# Patient Record
Sex: Male | Born: 1980 | Race: Black or African American | Hispanic: No | Marital: Married | Smoking: Current every day smoker
Health system: Southern US, Community
[De-identification: ages and names within clinical notes are randomized; demographics above are authoritative.]

## PROBLEM LIST (undated history)

## (undated) DIAGNOSIS — F32A Depression, unspecified: Secondary | ICD-10-CM

## (undated) DIAGNOSIS — J189 Pneumonia, unspecified organism: Secondary | ICD-10-CM

## (undated) DIAGNOSIS — E119 Type 2 diabetes mellitus without complications: Secondary | ICD-10-CM

## (undated) DIAGNOSIS — Z8719 Personal history of other diseases of the digestive system: Secondary | ICD-10-CM

## (undated) DIAGNOSIS — G4730 Sleep apnea, unspecified: Secondary | ICD-10-CM

## (undated) DIAGNOSIS — I10 Essential (primary) hypertension: Secondary | ICD-10-CM

## (undated) HISTORY — PX: WRIST SURGERY: SHX841

## (undated) HISTORY — DX: Type 2 diabetes mellitus without complications: E11.9

## (undated) HISTORY — DX: Essential (primary) hypertension: I10

## (undated) HISTORY — PX: WISDOM TOOTH EXTRACTION: SHX21

## (undated) HISTORY — DX: Personal history of other diseases of the digestive system: Z87.19

---

## 1999-05-13 ENCOUNTER — Emergency Department (HOSPITAL_COMMUNITY): Admission: EM | Admit: 1999-05-13 | Discharge: 1999-05-13 | Payer: Self-pay | Admitting: Emergency Medicine

## 2001-05-21 ENCOUNTER — Encounter: Payer: Self-pay | Admitting: Emergency Medicine

## 2001-05-21 ENCOUNTER — Emergency Department (HOSPITAL_COMMUNITY): Admission: EM | Admit: 2001-05-21 | Discharge: 2001-05-21 | Payer: Self-pay | Admitting: Emergency Medicine

## 2001-05-23 ENCOUNTER — Emergency Department (HOSPITAL_COMMUNITY): Admission: EM | Admit: 2001-05-23 | Discharge: 2001-05-23 | Payer: Self-pay | Admitting: Emergency Medicine

## 2001-05-29 ENCOUNTER — Emergency Department (HOSPITAL_COMMUNITY): Admission: EM | Admit: 2001-05-29 | Discharge: 2001-05-29 | Payer: Self-pay | Admitting: Emergency Medicine

## 2001-06-01 ENCOUNTER — Emergency Department (HOSPITAL_COMMUNITY): Admission: EM | Admit: 2001-06-01 | Discharge: 2001-06-01 | Payer: Self-pay | Admitting: Emergency Medicine

## 2001-06-07 ENCOUNTER — Emergency Department (HOSPITAL_COMMUNITY): Admission: EM | Admit: 2001-06-07 | Discharge: 2001-06-07 | Payer: Self-pay | Admitting: Emergency Medicine

## 2008-04-29 ENCOUNTER — Emergency Department (HOSPITAL_COMMUNITY): Admission: EM | Admit: 2008-04-29 | Discharge: 2008-04-29 | Payer: Self-pay | Admitting: Emergency Medicine

## 2009-01-25 ENCOUNTER — Emergency Department (HOSPITAL_COMMUNITY): Admission: EM | Admit: 2009-01-25 | Discharge: 2009-01-25 | Payer: Self-pay | Admitting: Family Medicine

## 2009-03-26 ENCOUNTER — Emergency Department (HOSPITAL_COMMUNITY): Admission: EM | Admit: 2009-03-26 | Discharge: 2009-03-26 | Payer: Self-pay | Admitting: Emergency Medicine

## 2009-06-05 ENCOUNTER — Emergency Department (HOSPITAL_COMMUNITY): Admission: EM | Admit: 2009-06-05 | Discharge: 2009-06-05 | Payer: Self-pay | Admitting: Emergency Medicine

## 2009-07-12 ENCOUNTER — Emergency Department (HOSPITAL_COMMUNITY): Admission: EM | Admit: 2009-07-12 | Discharge: 2009-07-13 | Payer: Self-pay | Admitting: Emergency Medicine

## 2009-07-14 ENCOUNTER — Emergency Department (HOSPITAL_COMMUNITY): Admission: EM | Admit: 2009-07-14 | Discharge: 2009-07-14 | Payer: Self-pay | Admitting: Emergency Medicine

## 2009-07-15 ENCOUNTER — Ambulatory Visit: Payer: Self-pay | Admitting: Internal Medicine

## 2009-07-15 ENCOUNTER — Inpatient Hospital Stay (HOSPITAL_COMMUNITY): Admission: EM | Admit: 2009-07-15 | Discharge: 2009-07-19 | Payer: Self-pay | Admitting: Emergency Medicine

## 2009-08-03 ENCOUNTER — Encounter: Payer: Self-pay | Admitting: Internal Medicine

## 2009-08-03 DIAGNOSIS — Z8719 Personal history of other diseases of the digestive system: Secondary | ICD-10-CM

## 2009-08-03 DIAGNOSIS — J189 Pneumonia, unspecified organism: Secondary | ICD-10-CM

## 2009-08-03 DIAGNOSIS — Z87448 Personal history of other diseases of urinary system: Secondary | ICD-10-CM

## 2009-08-03 HISTORY — DX: Personal history of other diseases of the digestive system: Z87.19

## 2010-08-28 LAB — POCT I-STAT, CHEM 8
BUN: 8 mg/dL (ref 6–23)
HCT: 42 % (ref 39.0–52.0)
Hemoglobin: 14.3 g/dL (ref 13.0–17.0)
Sodium: 135 mEq/L (ref 135–145)
TCO2: 25 mmol/L (ref 0–100)

## 2010-08-28 LAB — URINALYSIS, ROUTINE W REFLEX MICROSCOPIC
Glucose, UA: NEGATIVE mg/dL
Leukocytes, UA: NEGATIVE
Nitrite: NEGATIVE
Protein, ur: NEGATIVE mg/dL
Urobilinogen, UA: 1 mg/dL (ref 0.0–1.0)

## 2010-08-28 LAB — URINE MICROSCOPIC-ADD ON

## 2010-08-28 LAB — POCT CARDIAC MARKERS: Troponin i, poc: <0.05 ng/mL (ref 0.00–0.09)

## 2010-08-28 LAB — RAPID URINE DRUG SCREEN, HOSP PERFORMED
Amphetamines: NOT DETECTED
Tetrahydrocannabinol: POSITIVE — AB

## 2010-08-31 LAB — CBC
HCT: 40.5 % (ref 39.0–52.0)
HCT: 42.2 % (ref 39.0–52.0)
HCT: 43 % (ref 39.0–52.0)
Hemoglobin: 14.4 g/dL (ref 13.0–17.0)
MCHC: 33.3 g/dL (ref 30.0–36.0)
MCHC: 33.5 g/dL (ref 30.0–36.0)
MCV: 82.9 fL (ref 78.0–100.0)
MCV: 84.5 fL (ref 78.0–100.0)
Platelets: 211 10*3/uL (ref 150–400)
Platelets: 221 10*3/uL (ref 150–400)
Platelets: 228 10*3/uL (ref 150–400)
Platelets: 259 10*3/uL (ref 150–400)
RBC: 5.1 MIL/uL (ref 4.22–5.81)
RBC: 5.1 MIL/uL (ref 4.22–5.81)
RBC: 5.22 MIL/uL (ref 4.22–5.81)
RDW: 13.8 % (ref 11.5–15.5)
RDW: 14.3 % (ref 11.5–15.5)
WBC: 13.2 10*3/uL — ABNORMAL HIGH (ref 4.0–10.5)
WBC: 13.2 10*3/uL — ABNORMAL HIGH (ref 4.0–10.5)
WBC: 14.3 10*3/uL — ABNORMAL HIGH (ref 4.0–10.5)

## 2010-08-31 LAB — COMPREHENSIVE METABOLIC PANEL
ALT: 29 U/L (ref 0–53)
AST: 15 U/L (ref 0–37)
Albumin: 3.6 g/dL (ref 3.5–5.2)
CO2: 22 mEq/L (ref 19–32)
Calcium: 8.9 mg/dL (ref 8.4–10.5)
Creatinine, Ser: 0.95 mg/dL (ref 0.4–1.5)
GFR calc Af Amer: >60 mL/min (ref >60)
GFR calc non Af Amer: >60 mL/min (ref >60)
Sodium: 137 mEq/L (ref 135–145)

## 2010-08-31 LAB — URINALYSIS, ROUTINE W REFLEX MICROSCOPIC
Nitrite: NEGATIVE
Protein, ur: NEGATIVE mg/dL
Specific Gravity, Urine: 1.015 (ref 1.005–1.030)
Urobilinogen, UA: 1 mg/dL (ref 0.0–1.0)

## 2010-08-31 LAB — DIFFERENTIAL
Basophils Absolute: 0 10*3/uL (ref 0.0–0.1)
Basophils Relative: 0 % (ref 0–1)
Basophils Relative: 0 % (ref 0–1)
Eosinophils Absolute: 0 10*3/uL (ref 0.0–0.7)
Eosinophils Absolute: 0 10*3/uL (ref 0.0–0.7)
Eosinophils Absolute: 0.1 10*3/uL (ref 0.0–0.7)
Eosinophils Relative: 0 % (ref 0–5)
Eosinophils Relative: 0 % (ref 0–5)
Eosinophils Relative: 1 % (ref 0–5)
Lymphocytes Relative: 13 % (ref 12–46)
Lymphocytes Relative: 16 % (ref 12–46)
Lymphs Abs: 1.8 10*3/uL (ref 0.7–4.0)
Lymphs Abs: 2.1 10*3/uL (ref 0.7–4.0)
Lymphs Abs: 2.2 10*3/uL (ref 0.7–4.0)
Lymphs Abs: 2.4 10*3/uL (ref 0.7–4.0)
Monocytes Absolute: 1.5 10*3/uL — ABNORMAL HIGH (ref 0.1–1.0)
Monocytes Relative: 11 % (ref 3–12)
Monocytes Relative: 14 % — ABNORMAL HIGH (ref 3–12)
Neutro Abs: 9 10*3/uL — ABNORMAL HIGH (ref 1.7–7.7)
Neutro Abs: 9 10*3/uL — ABNORMAL HIGH (ref 1.7–7.7)
Neutrophils Relative %: 68 % (ref 43–77)
Neutrophils Relative %: 68 % (ref 43–77)
Neutrophils Relative %: 69 % (ref 43–77)

## 2010-08-31 LAB — URINE MICROSCOPIC-ADD ON

## 2010-08-31 LAB — BASIC METABOLIC PANEL
BUN: 7 mg/dL (ref 6–23)
CO2: 25 mEq/L (ref 19–32)
CO2: 25 mEq/L (ref 19–32)
CO2: 27 mEq/L (ref 19–32)
Calcium: 8.9 mg/dL (ref 8.4–10.5)
Chloride: 100 mEq/L (ref 96–112)
Chloride: 103 mEq/L (ref 96–112)
Creatinine, Ser: 1 mg/dL (ref 0.4–1.5)
GFR calc Af Amer: >60 mL/min (ref >60)
GFR calc Af Amer: >60 mL/min (ref >60)
GFR calc non Af Amer: >60 mL/min (ref >60)
Glucose, Bld: 103 mg/dL — ABNORMAL HIGH (ref 70–99)
Glucose, Bld: 117 mg/dL — ABNORMAL HIGH (ref 70–99)
Potassium: 3.3 mEq/L — ABNORMAL LOW (ref 3.5–5.1)
Potassium: 3.4 mEq/L — ABNORMAL LOW (ref 3.5–5.1)
Potassium: 3.9 mEq/L (ref 3.5–5.1)
Sodium: 136 mEq/L (ref 135–145)
Sodium: 137 mEq/L (ref 135–145)

## 2010-08-31 LAB — RAPID URINE DRUG SCREEN, HOSP PERFORMED
Amphetamines: NOT DETECTED
Benzodiazepines: NOT DETECTED
Tetrahydrocannabinol: POSITIVE — AB

## 2010-08-31 LAB — LIPASE, BLOOD: Lipase: 16 U/L (ref 11–59)

## 2010-09-15 LAB — GC/CHLAMYDIA PROBE AMP, GENITAL: Chlamydia, DNA Probe: NEGATIVE

## 2010-09-15 LAB — RPR: RPR Ser Ql: NONREACTIVE

## 2010-09-15 LAB — HIV ANTIBODY (ROUTINE TESTING W REFLEX): HIV: NONREACTIVE

## 2011-03-14 LAB — DIFFERENTIAL
Basophils Absolute: 0.1 10*3/uL (ref 0.0–0.1)
Eosinophils Absolute: 0.1 10*3/uL (ref 0.0–0.7)
Eosinophils Relative: 1 % (ref 0–5)
Lymphocytes Relative: 19 % (ref 12–46)
Lymphs Abs: 2.4 10*3/uL (ref 0.7–4.0)
Neutrophils Relative %: 69 % (ref 43–77)

## 2011-03-14 LAB — POCT URINALYSIS DIP (DEVICE)
Glucose, UA: NEGATIVE mg/dL
Ketones, ur: NEGATIVE mg/dL
Protein, ur: 30 mg/dL — AB
Urobilinogen, UA: 1 mg/dL (ref 0.0–1.0)

## 2011-03-14 LAB — CBC
HCT: 44.9 % (ref 39.0–52.0)
Platelets: 231 10*3/uL (ref 150–400)
RDW: 14 % (ref 11.5–15.5)
WBC: 12.7 10*3/uL — ABNORMAL HIGH (ref 4.0–10.5)

## 2011-03-14 LAB — OCCULT BLOOD X 1 CARD TO LAB, STOOL: Fecal Occult Bld: NEGATIVE

## 2011-05-09 ENCOUNTER — Emergency Department (HOSPITAL_COMMUNITY)
Admission: EM | Admit: 2011-05-09 | Discharge: 2011-05-09 | Discharge status: Against Medical Advice | Payer: Self-pay | Attending: Emergency Medicine | Admitting: Emergency Medicine

## 2011-05-09 DIAGNOSIS — R079 Chest pain, unspecified: Secondary | ICD-10-CM | POA: Insufficient documentation

## 2011-05-09 DIAGNOSIS — R05 Cough: Secondary | ICD-10-CM | POA: Insufficient documentation

## 2011-05-09 DIAGNOSIS — R059 Cough, unspecified: Secondary | ICD-10-CM | POA: Insufficient documentation

## 2011-05-09 DIAGNOSIS — R0602 Shortness of breath: Secondary | ICD-10-CM | POA: Insufficient documentation

## 2011-05-09 HISTORY — DX: Pneumonia, unspecified organism: J18.9

## 2011-05-09 NOTE — ED Notes (Signed)
Chest pain began this am with cough that increase the pain, denies any n/v/d feels like the same pain when he had pneumonia.   Pt. Is sob

## 2011-05-09 NOTE — ED Notes (Signed)
Called for pt in lobby.  Pt not located.

## 2011-11-20 IMAGING — CR DG LUMBAR SPINE COMPLETE 4+V
5 series · 5 of 5 positions shown · non-contrast
Comparison: None

CLINICAL DATA: Left lower abdomen and back pain, no trauma

LUMBAR SPINE - COMPLETE 4+ VIEW

[t l-spine a.p.]
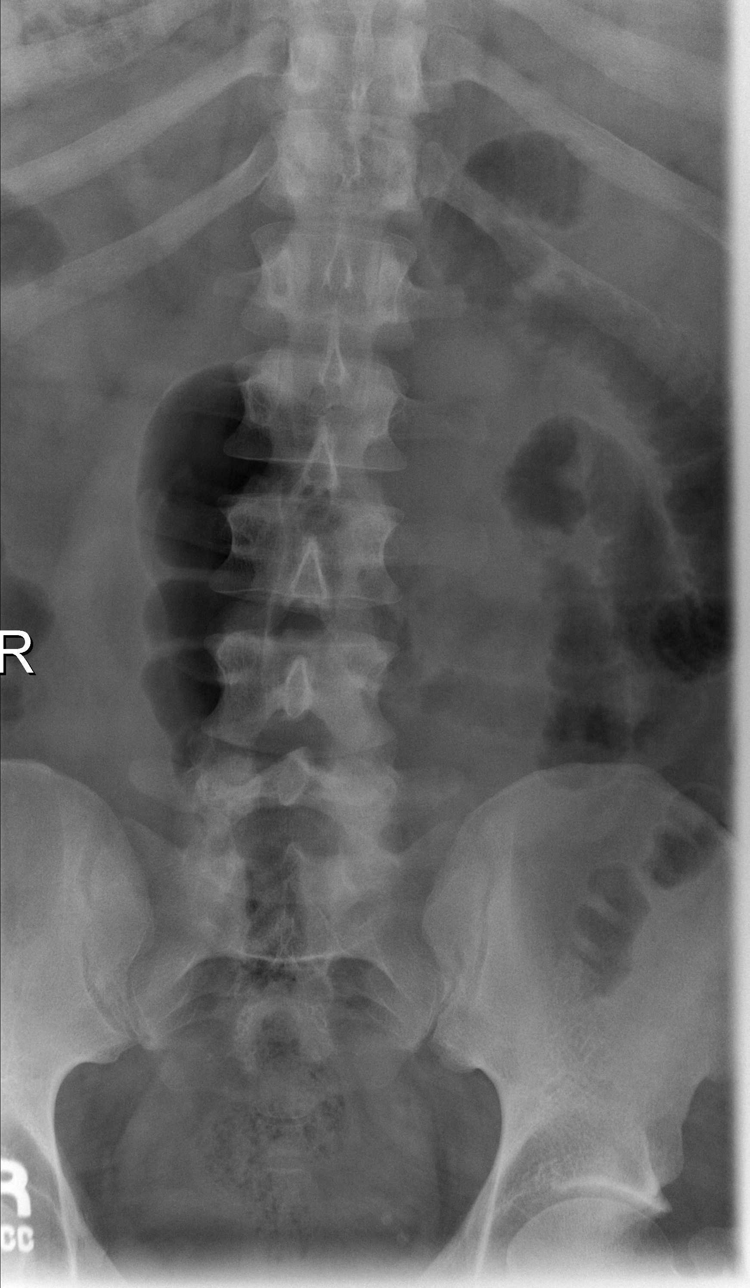

[t l-spine oblique exposure (1 of 2)]
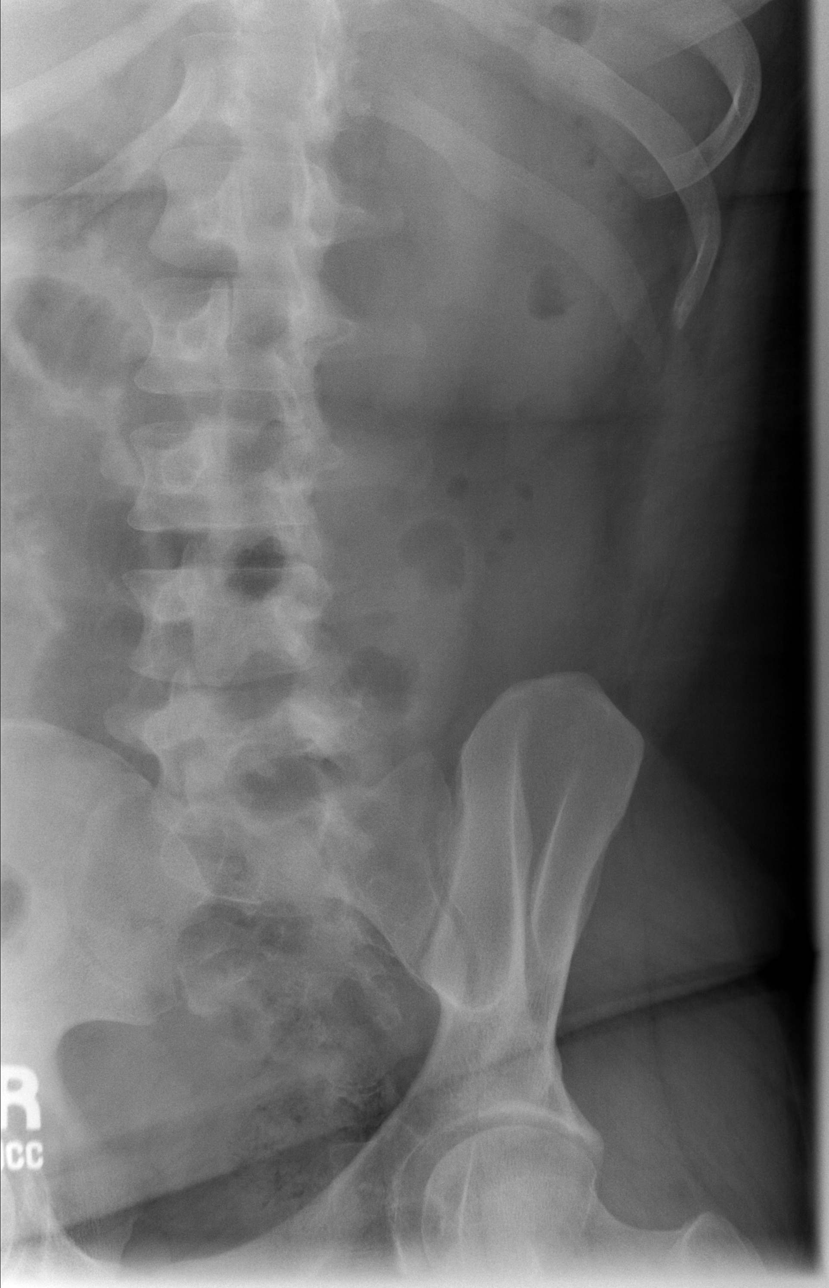

[t l-spine oblique exposure (2 of 2)]
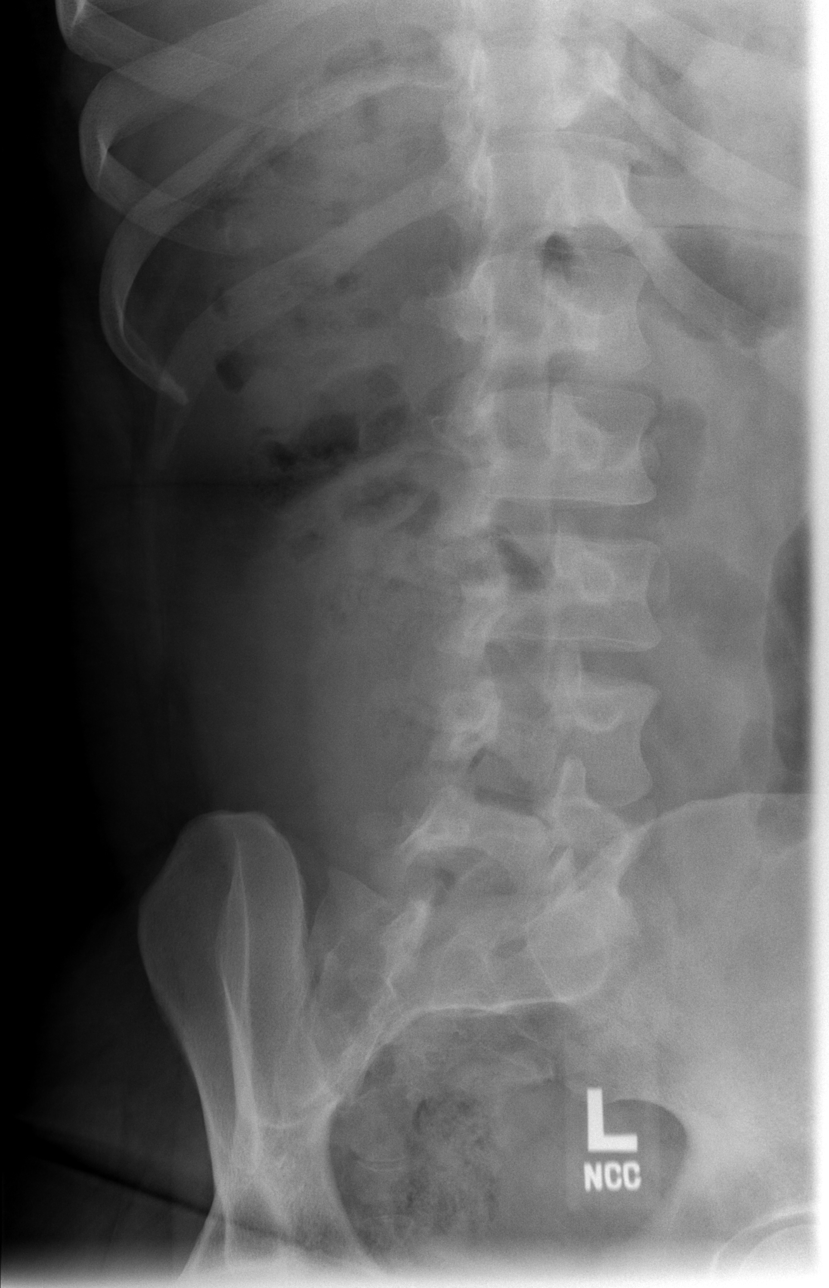

[t l-spine lat]
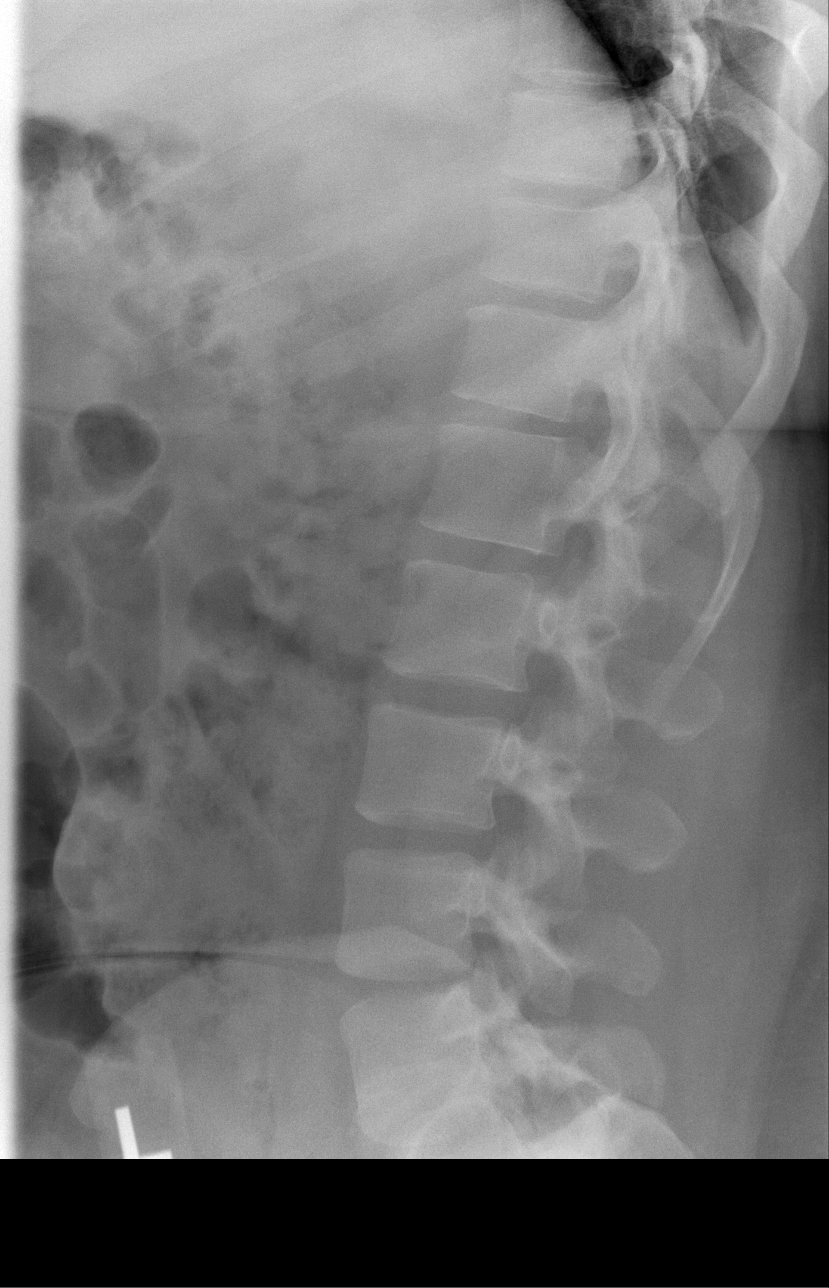

[t l-spine l5-s1 spot]
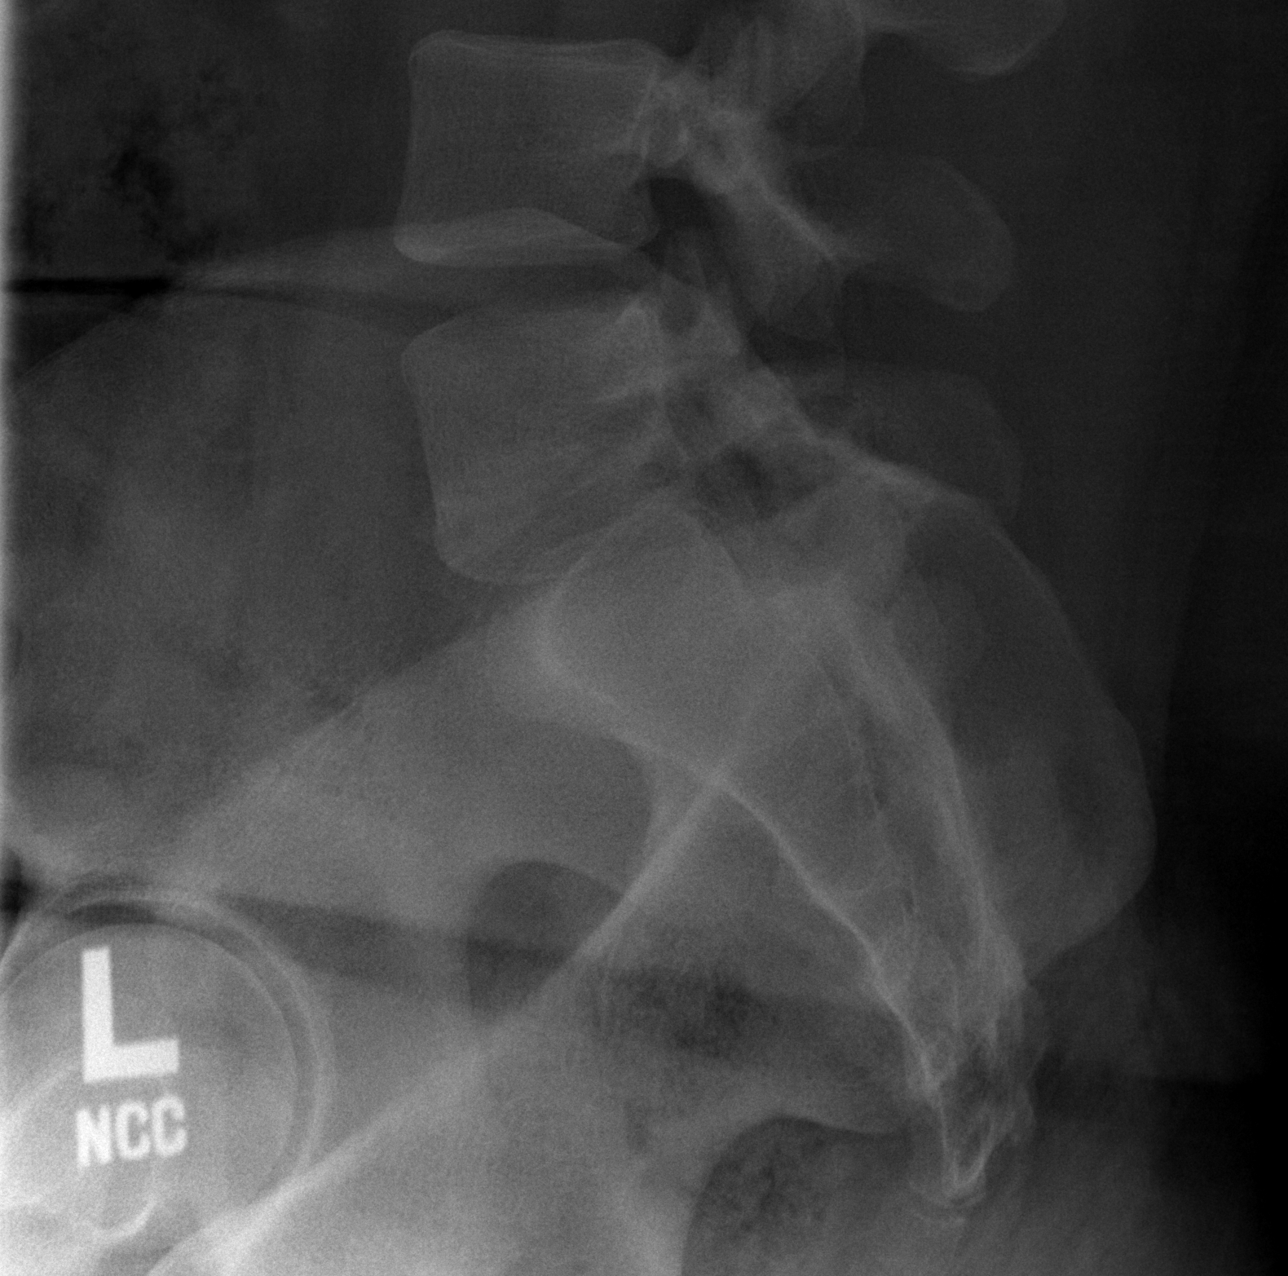

[5 of 5 positions shown; findings below may reference images not displayed]

FINDINGS: The lumbar vertebrae are in normal alignment with normal
intervertebral disc spaces.  No compression deformity is seen.
There may be pars defect at L5, but no malalignment is seen.  The
SI joints appear normally corticated
IMPRESSION: There is normal alignment with no acute abnormality.  Suspect pars
defect at L5.

## 2013-05-03 ENCOUNTER — Emergency Department (HOSPITAL_COMMUNITY)
Admission: EM | Admit: 2013-05-03 | Discharge: 2013-05-03 | Disposition: A | Discharge status: Home / Self Care | Payer: BC Managed Care – PPO | Attending: Emergency Medicine | Admitting: Emergency Medicine

## 2013-05-03 ENCOUNTER — Encounter (HOSPITAL_COMMUNITY): Payer: Self-pay | Admitting: Emergency Medicine

## 2013-05-03 DIAGNOSIS — Z8701 Personal history of pneumonia (recurrent): Secondary | ICD-10-CM | POA: Insufficient documentation

## 2013-05-03 DIAGNOSIS — M549 Dorsalgia, unspecified: Secondary | ICD-10-CM | POA: Insufficient documentation

## 2013-05-03 DIAGNOSIS — R51 Headache: Secondary | ICD-10-CM | POA: Insufficient documentation

## 2013-05-03 DIAGNOSIS — F17200 Nicotine dependence, unspecified, uncomplicated: Secondary | ICD-10-CM | POA: Insufficient documentation

## 2013-05-03 DIAGNOSIS — Z79899 Other long term (current) drug therapy: Secondary | ICD-10-CM | POA: Insufficient documentation

## 2013-05-03 DIAGNOSIS — J45909 Unspecified asthma, uncomplicated: Secondary | ICD-10-CM | POA: Insufficient documentation

## 2013-05-03 DIAGNOSIS — M62838 Other muscle spasm: Secondary | ICD-10-CM

## 2013-05-03 MED ORDER — KETOROLAC TROMETHAMINE 30 MG/ML IJ SOLN
30.0000 mg | Freq: Once | INTRAMUSCULAR | Status: AC
  Administered 2013-05-03: 30 mg via INTRAVENOUS
  Filled 2013-05-03: qty 1

## 2013-05-03 MED ORDER — IBUPROFEN 600 MG PO TABS: 600.0000 mg | ORAL_TABLET | Freq: Four times a day (QID) | ORAL | Status: DC | PRN

## 2013-05-03 MED ORDER — DIAZEPAM 5 MG PO TABS: 5.0000 mg | ORAL_TABLET | Freq: Two times a day (BID) | ORAL | Status: DC

## 2013-05-03 MED ORDER — DIAZEPAM 5 MG/ML IJ SOLN
10.0000 mg | Freq: Once | INTRAMUSCULAR | Status: AC
  Administered 2013-05-03: 10 mg via INTRAVENOUS
  Filled 2013-05-03: qty 2

## 2013-05-03 MED ORDER — KETOROLAC TROMETHAMINE 60 MG/2ML IM SOLN: 60.0000 mg | Freq: Once | INTRAMUSCULAR | Status: DC

## 2013-05-03 MED ORDER — SODIUM CHLORIDE 0.9 % IV BOLUS (SEPSIS)
1000.0000 mL | INTRAVENOUS | Status: AC
  Administered 2013-05-03: 1000 mL via INTRAVENOUS

## 2013-05-03 MED ORDER — MORPHINE SULFATE 4 MG/ML IJ SOLN: 4.0000 mg | Freq: Once | INTRAMUSCULAR | Status: DC

## 2013-05-03 NOTE — ED Provider Notes (Signed)
CSN: [id number]     Arrival date & time 05/03/13  1007 History   First MD Initiated Contact with Patient 05/03/13 1107     Chief Complaint  Patient presents with  . Neck Pain   (Consider location/radiation/quality/duration/timing/severity/associated sxs/prior Treatment) HPI Pt is a 32yo male c/o neck pain and stiffness. Pt reports waking up yesterday with neck pain and mild neck stiffness that gradually worsened into today. Reports pain is constant, sharp, aching, 8/10, worse with head movements.  States he was able to turn his head to left and right slightly yesterday but feels like he is unable to move it at all today. Denies recent trauma or falls. Denies recent illness including fever or cough. No hx of cancer or IVDU. Denies numbness or tingling in arms or legs. Denies decreased strength in arms. Has not tried anything for pain.  Past Medical History  Diagnosis Date  . Asthma   . Pneumonia    History reviewed. No pertinent past surgical history. Family History  Problem Relation Age of Onset  . Diabetes Mother   . Diabetes Father    History  Substance Use Topics  . Smoking status: Current Every Day Smoker  . Smokeless tobacco: Not on file  . Alcohol Use: Yes    Review of Systems  Constitutional: Negative for fever, chills, diaphoresis, fatigue and unexpected weight change.  Respiratory: Negative for shortness of breath.   Cardiovascular: Negative for chest pain.  Gastrointestinal: Negative for nausea, vomiting, abdominal pain and diarrhea.  Musculoskeletal: Positive for back pain, neck pain and neck stiffness.  Skin: Negative for color change, rash and wound.  Neurological: Positive for headaches.  All other systems reviewed and are negative.    Allergies  Review of patient's allergies indicates no known allergies.  Home Medications   Current Outpatient Rx  Name  Route  Sig  Dispense  Refill  . diazepam (VALIUM) 5 MG tablet   Oral   Take 1 tablet (5 mg total) by  mouth 2 (two) times daily.   10 tablet   0   . ibuprofen (ADVIL,MOTRIN) 600 MG tablet   Oral   Take 1 tablet (600 mg total) by mouth every 6 (six) hours as needed.   30 tablet   0    BP 131/86  Pulse 85  Temp(Src) 98.8 F (37.1 C) (Oral)  Resp 22  Ht 5\' 9"  (1.753 m)  Wt 327 lb (148.326 kg)  BMI 48.27 kg/m2  SpO2 98% Physical Exam  Nursing note and vitals reviewed. Constitutional: He is oriented to person, place, and time. He appears well-developed and well-nourished.  Pt sitting up in exam bed. Appears to be trying not to move his head much. Looks uncomfortable.  HENT:  Head: Normocephalic and atraumatic.  Eyes: Conjunctivae are normal. No scleral icterus.  Neck: Normal range of motion. No JVD present. No tracheal deviation present. No thyromegaly present.  Tenderness along cervical spine and paraspinal muscles.  Tenderness at base of skull.  Cardiovascular: Normal rate, regular rhythm and normal heart sounds.   Pulmonary/Chest: Effort normal and breath sounds normal. No stridor. No respiratory distress. He has no wheezes. He has no rales. He exhibits no tenderness.  No respiratory distress, able to speak in full sentences w/o difficulty. Lungs: CTAB  Abdominal: Soft. Bowel sounds are normal. He exhibits no distension and no mass. There is no tenderness. There is no rebound and no guarding.  Musculoskeletal: Normal range of motion. He exhibits tenderness. He exhibits no edema.  Tenderness along upper trapezius and cervical and thoracic paraspinal muscles.  5/5 grip strength. Nl gait.  Lymphadenopathy:    He has no cervical adenopathy.  Neurological: He is alert and oriented to person, place, and time. No cranial nerve deficit.  Skin: Skin is warm and dry.    ED Course  Procedures (including critical care time) Labs Review Labs Reviewed - No data to display Imaging Review No results found.  EKG Interpretation   None       MDM   1. Muscle spasms of head or neck     Pt c/o neck pain and stiffness.  Pt is afebrile, no recent illness or trauma. On exam, pt is tender in cervical spine and musculature.  Limited head movement due to pain.  Pt has no red flag symptoms. Not concerned for meningitis as pt is afebrile and otherwise appears well.  No evidence of trauma or hx of cancer, do not believe imaging is needed at this time. Pain likely due to severe muscle spasm.  Tx: valium and toradol then revaluate.    12:21 PM Pain improved after valium and toradol. Pt states he feels much better, is able to now turn his head from side to side with minimal pain, and feels comfortable being discharged home.    All questions answered and concerns addressed. Will discharge pt home and have pt f/u with [facility]: [facility] info provided. Return precautions given. Pt verbalized understanding and agreement with tx plan. Vitals: unremarkable. Discharged in stable condition.  Rx: valium and ibuprofen. Advised not to drive while taking valium.  Discussed pt with attending during ED encounter and agrees with plan.     Junius Finner, PA-C 05/03/13 1222

## 2013-05-03 NOTE — ED Notes (Signed)
Pt states yesterday when he woke up he had some neck pain.  Pt states when he woke up this morning he is unable to turn his head without extreme pain.  Pt states if he looks up it sends shooting pains down his back and up his head.  Pt denies any injury.

## 2013-05-03 NOTE — ED Provider Notes (Signed)
Medical screening examination/treatment/procedure(s) were performed by non-physician practitioner and as supervising physician I was immediately available for consultation/collaboration.  EKG Interpretation   None         Richardean Canal, MD 05/03/13 [id number]

## 2014-05-19 ENCOUNTER — Encounter (HOSPITAL_COMMUNITY): Payer: Self-pay | Admitting: Emergency Medicine

## 2014-05-19 ENCOUNTER — Emergency Department (HOSPITAL_COMMUNITY)
Admission: EM | Admit: 2014-05-19 | Discharge: 2014-05-19 | Disposition: A | Discharge status: Home / Self Care | Payer: Self-pay | Attending: Emergency Medicine | Admitting: Emergency Medicine

## 2014-05-19 ENCOUNTER — Emergency Department (HOSPITAL_COMMUNITY): Payer: BC Managed Care – PPO

## 2014-05-19 DIAGNOSIS — Z8701 Personal history of pneumonia (recurrent): Secondary | ICD-10-CM | POA: Insufficient documentation

## 2014-05-19 DIAGNOSIS — Y998 Other external cause status: Secondary | ICD-10-CM | POA: Insufficient documentation

## 2014-05-19 DIAGNOSIS — Y9289 Other specified places as the place of occurrence of the external cause: Secondary | ICD-10-CM | POA: Insufficient documentation

## 2014-05-19 DIAGNOSIS — S60221A Contusion of right hand, initial encounter: Secondary | ICD-10-CM | POA: Insufficient documentation

## 2014-05-19 DIAGNOSIS — T148XXA Other injury of unspecified body region, initial encounter: Secondary | ICD-10-CM

## 2014-05-19 DIAGNOSIS — Z720 Tobacco use: Secondary | ICD-10-CM | POA: Insufficient documentation

## 2014-05-19 DIAGNOSIS — Z79899 Other long term (current) drug therapy: Secondary | ICD-10-CM | POA: Insufficient documentation

## 2014-05-19 DIAGNOSIS — Y9389 Activity, other specified: Secondary | ICD-10-CM | POA: Insufficient documentation

## 2014-05-19 DIAGNOSIS — W2201XA Walked into wall, initial encounter: Secondary | ICD-10-CM | POA: Insufficient documentation

## 2014-05-19 DIAGNOSIS — J45909 Unspecified asthma, uncomplicated: Secondary | ICD-10-CM | POA: Insufficient documentation

## 2014-05-19 DIAGNOSIS — M79641 Pain in right hand: Secondary | ICD-10-CM

## 2014-05-19 MED ORDER — TETANUS-DIPHTH-ACELL PERTUSSIS 5-2.5-18.5 LF-MCG/0.5 IM SUSP
0.5000 mL | Freq: Once | INTRAMUSCULAR | Status: AC
  Administered 2014-05-19: 0.5 mL via INTRAMUSCULAR
  Filled 2014-05-19: qty 0.5

## 2014-05-19 MED ORDER — HYDROCODONE-ACETAMINOPHEN 5-325 MG PO TABS: 1.0000 | ORAL_TABLET | Freq: Four times a day (QID) | ORAL | Status: DC | PRN

## 2014-05-19 MED ORDER — HYDROCODONE-ACETAMINOPHEN 5-325 MG PO TABS
1.0000 | ORAL_TABLET | Freq: Once | ORAL | Status: AC
  Administered 2014-05-19: 1 via ORAL
  Filled 2014-05-19: qty 1

## 2014-05-19 MED ORDER — BACITRACIN ZINC 500 UNIT/GM EX OINT
1.0000 "application " | TOPICAL_OINTMENT | Freq: Once | CUTANEOUS | Status: AC
  Administered 2014-05-19: 1 via TOPICAL
  Filled 2014-05-19: qty 28.35

## 2014-05-19 MED ORDER — NAPROXEN 500 MG PO TABS: 500.0000 mg | ORAL_TABLET | Freq: Two times a day (BID) | ORAL | Status: DC | PRN

## 2014-05-19 NOTE — Discharge Instructions (Signed)
Wear wrist/hand splint as needed for comfort. Ice and elevate hand throughout the day. Alternate between naprosyn and norco for pain relief. Do not drive or operate machinery with pain medication use. Keep wound and clean with mild soap and water. Keep area covered with a topical antibiotic ointment and bandage, keep bandage dry. Ice and elevate for additional pain relief and swelling. Follow up with your primary care doctor or the [facility] in approximately 2 days for wound recheck. Monitor area for signs of infection to include, but not limited to: increasing pain, redness, drainage/pus, or swelling. Return to emergency department for emergent changing or worsening symptoms.  Use the list below to find a primary care doctor or see the [facility], in 1 week for recheck of ongoing pain.   Contusion A contusion is a deep bruise. Contusions happen when an injury causes bleeding under the skin. Signs of bruising include pain, puffiness (swelling), and discolored skin. The contusion may turn blue, purple, or yellow. HOME CARE   Put ice on the injured area.  Put ice in a plastic bag.  Place a towel between your skin and the bag.  Leave the ice on for 15-20 minutes, 03-04 times a day.  Only take medicine as told by your doctor.  Rest the injured area.  If possible, raise (elevate) the injured area to lessen puffiness. GET HELP RIGHT AWAY IF:   You have more bruising or puffiness.  You have pain that is getting worse.  Your puffiness or pain is not helped by medicine. MAKE SURE YOU:   Understand these instructions.  Will watch your condition.  Will get help right away if you are not doing well or get worse. Document Released: 11/15/2007 Document Revised: 08/21/2011 Document Reviewed: 04/03/2011 [facility] Patient Information 2015 [place]. This information is not intended to replace advice given to you by your health care provider. Make sure  you discuss any questions you have with your health care provider.  Cryotherapy Cryotherapy means treatment with cold. Ice or gel packs can be used to reduce both pain and swelling. Ice is the most helpful within the first 24 to 48 hours after an injury or flare-up from overusing a muscle or joint. Sprains, strains, spasms, burning pain, shooting pain, and aches can all be eased with ice. Ice can also be used when recovering from surgery. Ice is effective, has very few side effects, and is safe for most people to use. PRECAUTIONS  Ice is not a safe treatment option for people with:  Raynaud phenomenon. This is a condition affecting small blood vessels in the extremities. Exposure to cold may cause your problems to return.  Cold hypersensitivity. There are many forms of cold hypersensitivity, including:  Cold urticaria. Red, itchy hives appear on the skin when the tissues begin to warm after being iced.  Cold erythema. This is a red, itchy rash caused by exposure to cold.  Cold hemoglobinuria. Red blood cells break down when the tissues begin to warm after being iced. The hemoglobin that carry oxygen are passed into the urine because they cannot combine with blood proteins fast enough.  Numbness or altered sensitivity in the area being iced. If you have any of the following conditions, do not use ice until you have discussed cryotherapy with your caregiver:  Heart conditions, such as arrhythmia, angina, or chronic heart disease.  High blood pressure.  Healing wounds or open skin in the area being iced.  Current  infections.  Rheumatoid arthritis.  Poor circulation.  Diabetes. Ice slows the blood flow in the region it is applied. This is beneficial when trying to stop inflamed tissues from spreading irritating chemicals to surrounding tissues. However, if you expose your skin to cold temperatures for too long or without the proper protection, you can damage your skin or nerves. Watch  for signs of skin damage due to cold. HOME CARE INSTRUCTIONS Follow these tips to use ice and cold packs safely.  Place a dry or damp towel between the ice and skin. A damp towel will cool the skin more quickly, so you may need to shorten the time that the ice is used.  For a more rapid response, add gentle compression to the ice.  Ice for no more than 10 to 20 minutes at a time. The bonier the area you are icing, the less time it will take to get the benefits of ice.  Check your skin after 5 minutes to make sure there are no signs of a poor response to cold or skin damage.  Rest 20 minutes or more between uses.  Once your skin is numb, you can end your treatment. You can test numbness by very lightly touching your skin. The touch should be so light that you do not see the skin dimple from the pressure of your fingertip. When using ice, most people will feel these normal sensations in this order: cold, burning, aching, and numbness.  Do not use ice on someone who cannot communicate their responses to pain, such as small children or people with dementia. HOW TO MAKE AN ICE PACK Ice packs are the most common way to use ice therapy. Other methods include ice massage, ice baths, and cryosprays. Muscle creams that cause a cold, tingly feeling do not offer the same benefits that ice offers and should not be used as a substitute unless recommended by your caregiver. To make an ice pack, do one of the following:  Place crushed ice or a bag of frozen vegetables in a sealable plastic bag. Squeeze out the excess air. Place this bag inside another plastic bag. Slide the bag into a pillowcase or place a damp towel between your skin and the bag.  Mix 3 parts water with 1 part rubbing alcohol. Freeze the mixture in a sealable plastic bag. When you remove the mixture from the freezer, it will be slushy. Squeeze out the excess air. Place this bag inside another plastic bag. Slide the bag into a pillowcase or  place a damp towel between your skin and the bag. SEEK MEDICAL CARE IF:  You develop Morrill spots on your skin. This may give the skin a blotchy (mottled) appearance.  Your skin turns blue or pale.  Your skin becomes waxy or hard.  Your swelling gets worse. MAKE SURE YOU:   Understand these instructions.  Will watch your condition.  Will get help right away if you are not doing well or get worse. Document Released: 01/23/2011 Document Revised: 10/13/2013 Document Reviewed: 01/23/2011 [facility] Patient Information 2015 [place], [place]. This information is not intended to replace advice given to you by your health care provider. Make sure you discuss any questions you have with your health care provider.  Abrasion An abrasion is a cut or scrape of the skin. Abrasions do not extend through all layers of the skin and most heal within 10 days. It is important to care for your abrasion properly to prevent infection. CAUSES  Most abrasions are  caused by falling on, or gliding across, the ground or other surface. When your skin rubs on something, the outer and inner layer of skin rubs off, causing an abrasion. DIAGNOSIS  Your caregiver will be able to diagnose an abrasion during a physical exam.  TREATMENT  Your treatment depends on how large and deep the abrasion is. Generally, your abrasion will be cleaned with water and a mild soap to remove any dirt or debris. An antibiotic ointment may be put over the abrasion to prevent an infection. A bandage (dressing) may be wrapped around the abrasion to keep it from getting dirty.  You may need a tetanus shot if:  You cannot remember when you had your last tetanus shot.  You have never had a tetanus shot.  The injury broke your skin. If you get a tetanus shot, your arm may swell, get red, and feel warm to the touch. This is common and not a problem. If you need a tetanus shot and you choose not to have one, there is a rare chance of getting  tetanus. Sickness from tetanus can be serious.  HOME CARE INSTRUCTIONS   If a dressing was applied, change it at least once a day or as directed by your caregiver. If the bandage sticks, soak it off with warm water.   Wash the area with water and a mild soap to remove all the ointment 2 times a day. Rinse off the soap and pat the area dry with a clean towel.   Reapply any ointment as directed by your caregiver. This will help prevent infection and keep the bandage from sticking. Use gauze over the wound and under the dressing to help keep the bandage from sticking.   Change your dressing right away if it becomes wet or dirty.   Only take over-the-counter or prescription medicines for pain, discomfort, or fever as directed by your caregiver.   Follow up with your caregiver within 24-48 hours for a wound check, or as directed. If you were not given a wound-check appointment, look closely at your abrasion for redness, swelling, or pus. These are signs of infection. SEEK IMMEDIATE MEDICAL CARE IF:   You have increasing pain in the wound.   You have redness, swelling, or tenderness around the wound.   You have pus coming from the wound.   You have a fever or persistent symptoms for more than 2-3 days.  You have a fever and your symptoms suddenly get worse.  You have a bad smell coming from the wound or dressing.  MAKE SURE YOU:   Understand these instructions.  Will watch your condition.  Will get help right away if you are not doing well or get worse. Document Released: 03/08/2005 Document Revised: 05/15/2012 Document Reviewed: 05/02/2011 [facility] Patient Information 2015 [place], [place]. This information is not intended to replace advice given to you by your health care provider. Make sure you discuss any questions you have with your health care provider. Emergency Department Resource Guide 1) Find a Doctor and Pay Out of Pocket Although you won't have to find out who is  covered by your insurance plan, it is a good idea to ask around and get recommendations. You will then need to call the office and see if the doctor you have chosen will accept you as a new patient and what types of options they offer for patients who are self-pay. Some doctors offer discounts or will set up payment plans for their patients who do not  have insurance, but you will need to ask so you aren't surprised when you get to your appointment.  2) Contact Your Local Health Department Not all health departments have doctors that can see patients for sick visits, but many do, so it is worth a call to see if yours does. If you don't know where your local health department is, you can check in your phone book. The CDC also has a tool to help you locate your state's health department, and many state websites also have listings of all of their local health departments.  3) Find a Walk-in Clinic If your illness is not likely to be very severe or complicated, you may want to try a walk in clinic. These are popping up all over the country in pharmacies, drugstores, and shopping centers. They're usually staffed by nurse practitioners or physician assistants that have been trained to treat common illnesses and complaints. They're usually fairly quick and inexpensive. However, if you have serious medical issues or chronic medical problems, these are probably not your best option.  No Primary Care Doctor: - Call Health Connect at  [phone] - they can help you locate a primary care doctor that  accepts your insurance, provides certain services, etc. - Physician Referral Service- [phone]  Chronic Pain Problems: Organization         Address  Phone   Notes  [facility]  [phone] Patients need to be referred by their primary care doctor.   Medication Assistance: Organization         Address  Phone   Notes  [facility] Med[facility] [address] [place], [place] [address] [phone] --Must be a resident of [facility] [place] -- Must have NO insurance coverage whatsoever (no Medicaid/ Medicare, etc.) -- The pt. MUST have a primary care doctor that directs their care regularly and follows them in the community   [facility]  [phone]   [employer]  [phone]    Agencies that provide inexpensive medical care: Organization         Address  Phone   Notes  [facility]  [phone]   [facility]    [phone]   [facility] [address] [place], [place] [address] [phone]   [facility] [address], [place] [phone]   [facility]    [phone]   [facility]    [phone]   [facility]  [address], [place] Phone:  [phone], Fax:  [phone] Hours of Operation:  9 am - 6 pm, M-F.  Also accepts Medicaid/Medicare and self-pay.  [facility]  [address], [place] Phone: [phone], Fax: [phone]. Hours of Operation:  8:30 am - 5:30 pm, M-F.  Also accepts Medicaid and self-pay.  [facility] [place] [address], [place] Phone: [phone]   [facility] [address], [place], [place] [phone], Ext. [phone] Mondays & Thursdays: 7-9 AM.  First 15 patients are seen on a first come, first serve basis.    Medicaid-accepting [facility] Providers:  Organization         Address  Phone   Notes  [facility] [address],  [phone] Also accepts self-pay patients.  [facility] [address]  Laurell Josephs [place], [place]  [phone]   [facility] [address], [place] [phone]   [facility] [address] [place], [place] [phone]   Renaye Rakers [address], [place]   [phone] Only accepts [place] patients after they have their name applied to their card.   Self-Pay (no insurance) in [facility]:  Organization         Address  Phone   Notes  Sickle Cell Patients, [facility] [address] [place], [place] [phone]   [facility] [address] [place], [place] [phone]   [facility]  [address], [place] [phone]   [facility]/Dr. Osei-Bonsu  [address] [place] or [address], [place] [phone] Phone number for both [place] and [place] locations is the same.  [facility] and [facility] [address] [place] [phone]   [facility] [address], [place] or [address] [phone]   [facility] [address] [place] [phone], phone; [phone], fax Sees patients 1st and 3rd Saturday of every month.  Must not qualify for public or private insurance (i.e. Medicaid, Medicare, [place] Health Choice, Veterans' Benefits)  Household income should be no more than 200% of the poverty level The clinic cannot treat you if you are pregnant or think you are pregnant  Sexually transmitted diseases are not treated at the clinic.

## 2014-05-19 NOTE — ED Notes (Signed)
Pt c/o ight hand pain after punching a wall today

## 2014-05-19 NOTE — ED Provider Notes (Signed)
CSN: [id number]     Arrival date & time 05/19/14  1831 History  This chart was scribed for non-physician practitioner, Allen DerryMercedes Camprubi-Soms, PA-C working with Warnell Foresterrey Wofford, MD by Luisa DagoPriscilla Tutu, ED scribe. This patient was seen in room TR04C/TR04C and the patient's care was started at 9:00 PM.    Chief Complaint  Patient presents with  . Hand Injury   Patient is a 33 y.o. male presenting with hand injury. The history is provided by the patient. No language interpreter was used.  Hand Injury Location:  Hand Time since incident:  5 hours Injury: yes   Mechanism of injury comment:  Punched wall Hand location:  R hand Pain details:    Quality:  Sharp   Radiates to:  Does not radiate   Severity:  Moderate   Onset quality:  Sudden   Duration:  5 hours   Timing:  Constant   Progression:  Improving (after being given pain medication here in the ED) Chronicity:  New Handedness:  Right-handed Dislocation: no   Foreign body present:  No foreign bodies Tetanus status:  Out of date Prior injury to area:  No Relieved by:  Narcotics Worsened by:  Movement Ineffective treatments:  None tried Associated symptoms: decreased range of motion (mildly due to pain)   Associated symptoms: no back pain, no fever, no numbness, no stiffness, no swelling and no tingling    HPI Comments: Andre MalletQuentin T Reynolds is a 33 y.o. male who presents to the Emergency Department complaining of sudden onset right hand injury that occurred approximately 5 hours ago. Pt states that he got angry and punched the wall. He states that when he first arrived he was complaining of "10/10" sharp pain to the right hand. However, after being given vicodin here in the ED the pain has subsided to a "5/10". States it's intermittent and nonradiating. He states that the pain is worsened by gripping and movement. Pt endorses associated small abrasion to his knuckles from the wall. Bleeding is controlled. Pt does not recall the date of his last  tetanus vaccine. He denies taking any OTC medication prior to arrival. Endorses that movement of his fingers hurts therefore he has some decreased ROM but only due to pain. Still able to wiggle all digits and flex/extend at each joint. Denies any elbow pain, drainage from the wounds, wrist pain, loss of sensation, swelling, deformity, paraesthesia, numbness, weakness, easy bruising/bleeding, or hx of DM. Does not currently have a PCP. No known allergies.  Past Medical History  Diagnosis Date  . Asthma   . Pneumonia    History reviewed. No pertinent past surgical history. Family History  Problem Relation Age of Onset  . Diabetes Mother   . Diabetes Father    History  Substance Use Topics  . Smoking status: Current Every Day Smoker  . Smokeless tobacco: Not on file  . Alcohol Use: Yes    Review of Systems  Constitutional: Negative for fever and chills.  Musculoskeletal: Positive for joint swelling and arthralgias. Negative for myalgias, back pain and stiffness.  Skin: Positive for wound.  Neurological: Negative for weakness and numbness.  Hematological: Does not bruise/bleed easily.  10 Systems reviewed and all are negative for acute change except as noted in the HPI.    Allergies  Review of patient's allergies indicates no known allergies.  Home Medications   Prior to Admission medications   Medication Sig Start Date End Date Taking? Authorizing Provider  diazepam (VALIUM) 5 MG tablet Take 1  tablet (5 mg total) by mouth 2 (two) times daily. 05/03/13   Junius FinnerErin O'Malley, PA-C  ibuprofen (ADVIL,MOTRIN) 600 MG tablet Take 1 tablet (600 mg total) by mouth every 6 (six) hours as needed. 05/03/13   Junius FinnerErin O'Malley, PA-C   BP 127/86 mmHg  Pulse 91  Temp(Src) 98.1 F (36.7 C)  Resp 22  SpO2 98%   Physical Exam  Constitutional: He is oriented to person, place, and time. Vital signs are normal. He appears well-developed and well-nourished.  Non-toxic appearance. No distress.   Afebrile, nontoxic, NAD  HENT:  Head: Normocephalic and atraumatic.  Mouth/Throat: Mucous membranes are normal.  Eyes: Conjunctivae and EOM are normal.  Neck: Normal range of motion. Neck supple.  Cardiovascular: Normal rate and intact distal pulses.   Distal pulses intact, cap refill brisk and present in all digits  Pulmonary/Chest: Effort normal. No respiratory distress.  Abdominal: Normal appearance. He exhibits no distension.  Musculoskeletal:       Right hand: He exhibits decreased range of motion (due to pain), tenderness, bony tenderness and laceration (abrasion). He exhibits normal two-point discrimination, normal capillary refill, no deformity and no swelling. Normal sensation noted. Normal strength noted.       Hands: R hand with TTP over 4-5th MCP joints without bruising or deformity, no swelling or crepitus. Mildly decreased ROM due to pain, but preserved ROM in all joints in all digits. Small abrasion over 5th MCP and 4th PIP, no drainage or erythema, no ongoing bleeding. Does not appear c/w bite mark. Cap refill brisk and present. Remainder of hand and wrist nonTTP with FROM intact. Sensation grossly intact in all extremities, strength 5/5 in all extremities  Neurological: He is alert and oriented to person, place, and time. He has normal strength. No sensory deficit.  Skin: Skin is warm and dry. Abrasion noted.  Small abrasion to R hand as above  Psychiatric: He has a normal mood and affect. His behavior is normal.  Nursing note and vitals reviewed.   ED Course  Procedures (including critical care time)  DIAGNOSTIC STUDIES: Oxygen Saturation is 98% on RA, normal by my interpretation.    COORDINATION OF CARE: 9:46 PM- Will order a hand splint for comfort. Advised pt to apply cold compresses to the affected hand. Advised him to also follow up in 2 days with his PCP. Pt does not have a PCP, will give pt a referral to local PCP. Pt advised of plan for treatment and pt  agrees.  Medications  HYDROcodone-acetaminophen (NORCO/VICODIN) 5-325 MG per tablet 1 tablet (1 tablet Oral Given 05/19/14 2040)    Imaging Review Dg Hand Complete Right  05/19/2014   CLINICAL DATA:  Punched a wall, pain in fifth metacarpal  EXAM: RIGHT HAND - COMPLETE 3+ VIEW  COMPARISON:  None.  FINDINGS: There is no evidence of fracture or dislocation. There is no evidence of arthropathy or other focal bone abnormality. Soft tissues are unremarkable.  IMPRESSION: Negative.   Electronically Signed   By: Elige KoHetal  Patel   On: 05/19/2014 21:22    MDM   Final diagnoses:  Right hand pain  Abrasion  Contusion    33 y.o. male who punched a wall now with R hand pain and small abrasion to MCP joint of 5th digit and PIP joint of 4th digit. ROM limited to pain but all joints with ROM preserved. Neurovascularly intact with soft compartments. Xray obtained which was neg. Applied splint for comfort. Vicodin given in triage with relief of pain. Tetanus  updated and dressing applied. No need for abx given that injury occurred from a wall and not a human mouth, and pt has no RFs for infection. Discussed wound check in 2 days, and proper wound care. Discussed RICE therapy to help with pain and swelling. Rx for pain control given. Will have pt see a PCP from [facility] for ongoing management, doubt pt needs hand referral at this time. Dental pain associated with dental infection and possible dental abscess with patient afebrile, non toxic appearing and swallowing secretions well. I gave patient referral to dentist and stressed the importance of dental follow up for ultimate management of dental pain.  I have also discussed reasons to return immediately to the ER.  Patient expresses understanding and agrees with plan.  I will also give doxycycline and pain control.    I personally performed the services described in this documentation, which was scribed in my presence. The recorded  information has been reviewed and is accurate.  BP 127/86 mmHg  Pulse 91  Temp(Src) 98.1 F (36.7 C)  Resp 22  SpO2 98%  Meds ordered this encounter  Medications  . HYDROcodone-acetaminophen (NORCO/VICODIN) 5-325 MG per tablet 1 tablet    Sig:   . bacitracin ointment 1 application    Sig:   . Tdap (BOOSTRIX) injection 0.5 mL    Sig:   . naproxen (NAPROSYN) 500 MG tablet    Sig: Take 1 tablet (500 mg total) by mouth 2 (two) times daily as needed for mild pain, moderate pain or headache (TAKE WITH MEALS.).    Dispense:  20 tablet    Refill:  0    Order Specific Question:  Supervising Provider    Answer:  Eber Hong D [[id number]]  . HYDROcodone-acetaminophen (NORCO) 5-325 MG per tablet    Sig: Take 1 tablet by mouth every 6 (six) hours as needed for severe pain.    Dispense:  6 tablet    Refill:  0    Order Specific Question:  Supervising Provider    Answer:  Vida Roller [address] Wendee Hata Camprubi-Soms, PA-C 05/19/14 4098  Warnell Forester, MD 05/23/14 1501

## 2014-05-19 NOTE — ED Notes (Signed)
Pt to xray at this time.

## 2014-10-15 ENCOUNTER — Emergency Department (HOSPITAL_COMMUNITY)
Admission: EM | Admit: 2014-10-15 | Discharge: 2014-10-15 | Disposition: A | Discharge status: Home / Self Care | Payer: Self-pay | Attending: Emergency Medicine | Admitting: Emergency Medicine

## 2014-10-15 ENCOUNTER — Encounter (HOSPITAL_COMMUNITY): Payer: Self-pay | Admitting: Emergency Medicine

## 2014-10-15 DIAGNOSIS — Z87448 Personal history of other diseases of urinary system: Secondary | ICD-10-CM

## 2014-10-15 DIAGNOSIS — Z202 Contact with and (suspected) exposure to infections with a predominantly sexual mode of transmission: Secondary | ICD-10-CM | POA: Insufficient documentation

## 2014-10-15 DIAGNOSIS — J45909 Unspecified asthma, uncomplicated: Secondary | ICD-10-CM | POA: Insufficient documentation

## 2014-10-15 DIAGNOSIS — R300 Dysuria: Secondary | ICD-10-CM | POA: Insufficient documentation

## 2014-10-15 DIAGNOSIS — Z8701 Personal history of pneumonia (recurrent): Secondary | ICD-10-CM | POA: Insufficient documentation

## 2014-10-15 DIAGNOSIS — Z720 Tobacco use: Secondary | ICD-10-CM | POA: Insufficient documentation

## 2014-10-15 LAB — URINALYSIS, ROUTINE W REFLEX MICROSCOPIC
BILIRUBIN URINE: NEGATIVE
Glucose, UA: NEGATIVE mg/dL
KETONES UR: NEGATIVE mg/dL
Leukocytes, UA: NEGATIVE
Nitrite: NEGATIVE
PROTEIN: NEGATIVE mg/dL
Specific Gravity, Urine: 1.026 (ref 1.005–1.030)
UROBILINOGEN UA: 1 mg/dL (ref 0.0–1.0)
pH: 5.5 (ref 5.0–8.0)

## 2014-10-15 LAB — URINE MICROSCOPIC-ADD ON

## 2014-10-15 MED ORDER — LIDOCAINE HCL (PF) 1 % IJ SOLN
INTRAMUSCULAR | Status: AC
  Administered 2014-10-15: 19:00:00
  Filled 2014-10-15: qty 5

## 2014-10-15 MED ORDER — AZITHROMYCIN 250 MG PO TABS
1000.0000 mg | ORAL_TABLET | Freq: Once | ORAL | Status: AC
  Administered 2014-10-15: 1000 mg via ORAL
  Filled 2014-10-15: qty 4

## 2014-10-15 MED ORDER — CEFTRIAXONE SODIUM 250 MG IJ SOLR
250.0000 mg | Freq: Once | INTRAMUSCULAR | Status: AC
  Administered 2014-10-15: 250 mg via INTRAMUSCULAR
  Filled 2014-10-15: qty 250

## 2014-10-15 NOTE — ED Notes (Signed)
Pt requesting to be checked for STD's.  St's he received a phone call this am and was told he was possibly exposed to STD

## 2014-10-15 NOTE — ED Provider Notes (Signed)
CSN: [id number]     Arrival date & time 10/15/14  1747 History  This chart was scribed for non-physician practitioner, Fayrene HelperBowie Oriel Rumbold, PA-C, working with Blake DivineJohn Wofford, MD, by Modena JanskyAlbert Thayil, ED Scribe. This patient was seen in room TR08C/TR08C and the patient's care was started at 6:03 PM.   Chief Complaint  Patient presents with  . Exposure to STD   The history is provided by the patient. No language interpreter was used.   HPI Comments: Andre MalletQuentin T Reynolds is a 34 y.o. male with no hx of chronic medical concerns who presents to the Emergency Department complaining of a possible STD exposure. He reports that his partner's provider said today that she may have an STD, possibly a yeast infection. Pt states that he has been having a burning sensation while he urinates the past 2 days, so he came to the ED today out of concern for an STD exposure. He reports that he has had intercourse with 2 partners without protection lately. He denies any penile discharge, urinary frequency, back pain, fever, chills, or abdominal pain.    Past Medical History  Diagnosis Date  . Asthma   . Pneumonia    History reviewed. No pertinent past surgical history. Family History  Problem Relation Age of Onset  . Diabetes Mother   . Diabetes Father    History  Substance Use Topics  . Smoking status: Current Every Day Smoker  . Smokeless tobacco: Not on file  . Alcohol Use: Yes    Review of Systems  Constitutional: Negative for fever and chills.  Gastrointestinal: Negative for abdominal pain.  Genitourinary: Positive for dysuria. Negative for frequency and discharge.  Musculoskeletal: Negative for back pain.    Allergies  Review of patient's allergies indicates no known allergies.  Home Medications   Prior to Admission medications   Medication Sig Start Date End Date Taking? Authorizing Provider  diazepam (VALIUM) 5 MG tablet Take 1 tablet (5 mg total) by mouth 2 (two) times daily. Patient not taking: Reported  on 05/19/2014 05/03/13   Junius FinnerErin O'Malley, PA-C  HYDROcodone-acetaminophen (NORCO) 5-325 MG per tablet Take 1 tablet by mouth every 6 (six) hours as needed for severe pain. 05/19/14   Mercedes Camprubi-Soms, PA-C  ibuprofen (ADVIL,MOTRIN) 600 MG tablet Take 1 tablet (600 mg total) by mouth every 6 (six) hours as needed. 05/03/13   Junius FinnerErin O'Malley, PA-C  naproxen (NAPROSYN) 500 MG tablet Take 1 tablet (500 mg total) by mouth 2 (two) times daily as needed for mild pain, moderate pain or headache (TAKE WITH MEALS.). 05/19/14   Mercedes Camprubi-Soms, PA-C   BP 142/97 mmHg  Pulse 104  Temp(Src) 98.7 F (37.1 C) (Oral)  Resp 20  SpO2 98% Physical Exam  Constitutional: He appears well-developed and well-nourished. No distress.  HENT:  Head: Normocephalic and atraumatic.  Cardiovascular: Normal rate, regular rhythm and normal heart sounds.  Exam reveals no gallop and no friction rub.   No murmur heard. Pulmonary/Chest: Effort normal. No respiratory distress. He has no wheezes. He has no rales.  Abdominal: Soft.  No CVA tenderness.   Genitourinary:  No inguinal rash or hernia noted. Scrotum appears normal without rash. No noted discharge. Uncircumcised penis.   Nursing note and vitals reviewed.   ED Course  Procedures (including critical care time) DIAGNOSTIC STUDIES: Oxygen Saturation is 98% on RA, normal by my interpretation.    COORDINATION OF CARE: 6:07 PM- Pt advised of plan for treatment. We discussed that about treatment involving medication. Also prevention  optinons that include abstinence and using protection during intercourse. Pt agrees with treatment plan.  7:14 PM Urine shows no evidence of urinary tract infection however patient has evidence of hemoglobin in his urine. It appears the patient has chronic hematuria. He does not have any flank pain to suggest kidney stone. I recommend patient to follow-up with urologist for further evaluation of his recurrent hematuria. Otherwise patient  will be treated with Rocephin and Zithromax. STD cultures sent.  Labs Review Labs Reviewed - No data to display  Imaging Review No results found.   EKG Interpretation None      MDM   Final diagnoses:  Exposure to STD  H/O: hematuria    BP 142/97 mmHg  Pulse 104  Temp(Src) 98.7 F (37.1 C) (Oral)  Resp 20  SpO2 98%  I personally performed the services described in this documentation, which was scribed in my presence. The recorded information has been reviewed and is accurate.      Fayrene HelperBowie Hermon Zea, PA-C 10/15/14 1918  Blake DivineJohn Wofford, MD 10/17/14 1002

## 2014-10-15 NOTE — ED Notes (Signed)
Declined W/C at D/C and was escorted to lobby by RN. 

## 2014-10-15 NOTE — Discharge Instructions (Signed)

## 2014-10-16 LAB — HIV ANTIBODY (ROUTINE TESTING W REFLEX): HIV Screen 4th Generation wRfx: NONREACTIVE

## 2014-10-16 LAB — RPR: RPR Ser Ql: NONREACTIVE

## 2014-11-02 ENCOUNTER — Telehealth (HOSPITAL_COMMUNITY): Payer: Self-pay

## 2015-12-26 ENCOUNTER — Emergency Department (HOSPITAL_COMMUNITY)
Admission: EM | Admit: 2015-12-26 | Discharge: 2015-12-26 | Disposition: A | Discharge status: Home / Self Care | Payer: Medicaid Other | Attending: Emergency Medicine | Admitting: Emergency Medicine

## 2015-12-26 ENCOUNTER — Encounter (HOSPITAL_COMMUNITY): Payer: Self-pay

## 2015-12-26 ENCOUNTER — Emergency Department (HOSPITAL_COMMUNITY): Payer: Medicaid Other

## 2015-12-26 DIAGNOSIS — F17200 Nicotine dependence, unspecified, uncomplicated: Secondary | ICD-10-CM | POA: Insufficient documentation

## 2015-12-26 DIAGNOSIS — R0789 Other chest pain: Secondary | ICD-10-CM | POA: Insufficient documentation

## 2015-12-26 DIAGNOSIS — Z79899 Other long term (current) drug therapy: Secondary | ICD-10-CM | POA: Diagnosis not present

## 2015-12-26 DIAGNOSIS — J45909 Unspecified asthma, uncomplicated: Secondary | ICD-10-CM | POA: Diagnosis not present

## 2015-12-26 LAB — CBC
HCT: 41.6 % (ref 39.0–52.0)
HEMOGLOBIN: 14.2 g/dL (ref 13.0–17.0)
MCH: 28.2 pg (ref 26.0–34.0)
MCHC: 34.1 g/dL (ref 30.0–36.0)
MCV: 82.7 fL (ref 78.0–100.0)
Platelets: 241 10*3/uL (ref 150–400)
RBC: 5.03 MIL/uL (ref 4.22–5.81)
RDW: 14.1 % (ref 11.5–15.5)
WBC: 8.2 10*3/uL (ref 4.0–10.5)

## 2015-12-26 LAB — BASIC METABOLIC PANEL
Anion gap: 8 (ref 5–15)
BUN: 11 mg/dL (ref 6–20)
CO2: 24 mmol/L (ref 22–32)
Calcium: 9 mg/dL (ref 8.9–10.3)
Chloride: 103 mmol/L (ref 101–111)
Creatinine, Ser: 0.92 mg/dL (ref 0.61–1.24)
GFR calc Af Amer: >60 mL/min (ref >60)
GFR calc non Af Amer: >60 mL/min (ref >60)
GLUCOSE: 172 mg/dL — AB (ref 65–99)
Potassium: 3.6 mmol/L (ref 3.5–5.1)
Sodium: 135 mmol/L (ref 135–145)

## 2015-12-26 LAB — I-STAT TROPONIN, ED: TROPONIN I, POC: 0 ng/mL (ref 0.00–0.08)

## 2015-12-26 NOTE — ED Notes (Signed)
Patient here with left anterior chest pain that he describes as sharp since last pm, reports pain worse with inspiration.

## 2015-12-26 NOTE — Discharge Instructions (Signed)
Please follow-up with your primary care provider for reevaluation further management. If you experience any new or worsening signs or symptoms please return immediately to the emergency room. Chest Wall Pain Chest wall pain is pain in or around the bones and muscles of your chest. Sometimes, an injury causes this pain. Sometimes, the cause may not be known. This pain may take several weeks or longer to get better. HOME CARE INSTRUCTIONS  Pay attention to any changes in your symptoms. Take these actions to help with your pain:   Rest as told by your health care provider.   Avoid activities that cause pain. These include any activities that use your chest muscles or your abdominal and side muscles to lift heavy items.   If directed, apply ice to the painful area:  Put ice in a plastic bag.  Place a towel between your skin and the bag.  Leave the ice on for 20 minutes, 2-3 times per day.  Take over-the-counter and prescription medicines only as told by your health care provider.  Do not use tobacco products, including cigarettes, chewing tobacco, and e-cigarettes. If you need help quitting, ask your health care provider.  Keep all follow-up visits as told by your health care provider. This is important. SEEK MEDICAL CARE IF:  You have a fever.  Your chest pain becomes worse.  You have new symptoms. SEEK IMMEDIATE MEDICAL CARE IF:  You have nausea or vomiting.  You feel sweaty or light-headed.  You have a cough with phlegm (sputum) or you cough up blood.  You develop shortness of breath.   This information is not intended to replace advice given to you by your health care provider. Make sure you discuss any questions you have with your health care provider.   Document Released: 05/29/2005 Document Revised: 02/17/2015 Document Reviewed: 08/24/2014 Elsevier Interactive Patient Education [employer]2016 [employer] Inc.

## 2015-12-26 NOTE — ED Notes (Signed)
PA at bedside.

## 2015-12-26 NOTE — ED Notes (Signed)
Patient returned from X-ray. Placed back on the monitor no complaints at this time.

## 2015-12-26 NOTE — ED Provider Notes (Signed)
CSN: [id number]     Arrival date & time 12/26/15  [id number] History   First MD Initiated Contact with Patient 12/26/15 [phone]     Chief Complaint  Patient presents with  . Chest Pain    HPI   35 year old male presents today with complaints of chest pain. Patient reports yesterday he was sitting in his chair when he started to develop left anterior chest wall pain. He described it as sharp, no radiation of symptoms not associated with nausea vomiting diaphoresis or shortness of breath. He reports the pain is constant worse with deep inspiration, significantly worse with palpation of the left anterior chest wall. Patient has not tried any over-the-counter medications prior to arrival. Denies any relieving factors. Patient denies any nausea, vomiting, diaphoresis, dyspnea, syncope, palpitations, fever, peripheral edema, abdominal pain or cough. Patient has not had any previous episodes chest pain. He has no history of cardiac problems, no significant family history.  Cardiac History Cardiologist: none  MI: no previous Studies: Echo no , stress test no Surgeries/ Interventions: Caths no, stents no, bipass bo Cardiac anatomy: normal  Risk Factors: Smoker;  No hypertension, high cholesterol, diabetes, peripheral vascular disease, malignancy, cocaine or illicit drug use.   Pulmonary History:  Smoking: 10 PY also smokes marijuana  Asthma/ COPD/ Bronchitis: Asthma- no meds PE Risk factors:  No risk factors  clinical signs of DVT, PE is #1 or likely diagnosis, heart rate greater than 100, immobilization at 3 days or surgery within last 4, Previous objective PE or DVT, Hemoptysis, Malignancy w/ treatment within 6 months  GI History:  No h/o  Vascular: no   Significant Family History: no family history     Past Medical History  Diagnosis Date  . Asthma   . Pneumonia    History reviewed. No pertinent past surgical history. Family History  Problem Relation Age of Onset  . Diabetes Mother   .  Diabetes Father    Social History  Substance Use Topics  . Smoking status: Current Every Day Smoker  . Smokeless tobacco: None  . Alcohol Use: Yes    Review of Systems  All other systems reviewed and are negative.   Allergies  Review of patient's allergies indicates no known allergies.  Home Medications   Prior to Admission medications   Medication Sig Start Date End Date Taking? Authorizing Provider  diazepam (VALIUM) 5 MG tablet Take 1 tablet (5 mg total) by mouth 2 (two) times daily. Patient not taking: Reported on 05/19/2014 05/03/13   Junius FinnerErin O'Malley, PA-C  HYDROcodone-acetaminophen (NORCO) 5-325 MG per tablet Take 1 tablet by mouth every 6 (six) hours as needed for severe pain. 05/19/14   Mercedes Camprubi-Soms, PA-C  ibuprofen (ADVIL,MOTRIN) 600 MG tablet Take 1 tablet (600 mg total) by mouth every 6 (six) hours as needed. 05/03/13   Junius FinnerErin O'Malley, PA-C  naproxen (NAPROSYN) 500 MG tablet Take 1 tablet (500 mg total) by mouth 2 (two) times daily as needed for mild pain, moderate pain or headache (TAKE WITH MEALS.). 05/19/14   Mercedes Camprubi-Soms, PA-C   BP 131/97 mmHg  Pulse 79  Temp(Src) 98.2 F (36.8 C) (Oral)  Resp 22  Ht 5\' 8"  (1.727 m)  Wt 154.949 kg  BMI 51.95 kg/m2  SpO2 95%   Physical Exam  Constitutional: He is oriented to person, place, and time. He appears well-developed and well-nourished.  HENT:  Head: Normocephalic and atraumatic.  Eyes: Conjunctivae are normal. Pupils are equal, round, and reactive to light. Right eye exhibits  no discharge. Left eye exhibits no discharge. No scleral icterus.  Neck: Normal range of motion. No JVD present. No tracheal deviation present.  Cardiovascular: Normal rate, regular rhythm, normal heart sounds and intact distal pulses.  Exam reveals no gallop and no friction rub.   No murmur heard. Pulmonary/Chest: Effort normal and breath sounds normal. No stridor. No respiratory distress. He has no wheezes. He has no rales. He  exhibits no tenderness.  Exquisitely tender to even light palpation of the left anterior chest, no obvious tachypnea, rash, swelling or edema  Abdominal: Soft. He exhibits no distension and no mass. There is no tenderness. There is no rebound and no guarding.  Musculoskeletal: Normal range of motion. He exhibits no edema or tenderness.  No lower extremity swelling or edema  Neurological: He is alert and oriented to person, place, and time. Coordination normal.  Skin: Skin is warm and dry. No rash noted. No erythema. No pallor.  Psychiatric: He has a normal mood and affect. His behavior is normal. Judgment and thought content normal.  Nursing note and vitals reviewed.   ED Course  Procedures (including critical care time) Labs Review Labs Reviewed  BASIC METABOLIC PANEL - Abnormal; Notable for the following:    Glucose, Bld 172 (*)    All other components within normal limits  CBC  I-STAT TROPOININ, ED    Imaging Review Dg Chest 2 View  12/26/2015  CLINICAL DATA:  Left-sided chest pain and shortness of breath. EXAM: CHEST  2 VIEW COMPARISON:  07/14/2009 FINDINGS: The heart size and mediastinal contours are within normal limits. Both lungs are clear. The visualized skeletal structures are unremarkable. IMPRESSION: No active cardiopulmonary disease. Electronically Signed   By: Signa Kell M.D.   On: 12/26/2015 08:45   I have personally reviewed and evaluated these images and lab results as part of my medical decision-making.   EKG Interpretation   Date/Time:  Sunday December 26 2015 07:54:24 EDT Ventricular Rate:  92 PR Interval:  162 QRS Duration: 86 QT Interval:  382 QTC Calculation: 472 R Axis:   47 Text Interpretation:  Normal sinus rhythm Normal ECG Confirmed by  Rubin Payor  MD, Harrold Donath [id number]) on 12/26/2015 8:06:33 AM Also confirmed by  Rubin Payor  MD, NATHAN [phone]), editor Whitney Post, Cala Bradford [phone])  on  12/26/2015 8:54:38 AM      MDM   Final diagnoses:  Chest wall pain     Labs:I-STAT troponin, BMP and CBC  Imaging: DG chest 2 view  Consults:  Therapeutics:  Discharge Meds:   Assessment/Plan: 36 year old male presents today with chest pain. He is exquisitely tender on exam to even light palpation.  He has a hard score of 1, with very unlikely ACS signs and symptoms. Patient is perk negative with reassuring vital signs, low suspicion for PE. Patient has no signs of infectious etiology on exam, low suspicion for dissection. Patient's symptoms likely musculoskeletal with reproducible chest pain. Patient was discharged home with instructions to use ibuprofen and Tylenol as needed for pain, monitor for new or worsening signs or symptoms to return immediately if any present. Patient verbalizes understanding and agreement to today's plan had no further questions or concerns        Eyvonne Mechanic, PA-C 12/26/15 1604  Benjiman Core, MD 12/26/15 [phone]

## 2016-06-07 ENCOUNTER — Ambulatory Visit (INDEPENDENT_AMBULATORY_CARE_PROVIDER_SITE_OTHER): Payer: Medicaid Other | Admitting: Obstetrics and Gynecology

## 2016-06-07 ENCOUNTER — Encounter: Payer: Self-pay | Admitting: Obstetrics and Gynecology

## 2016-06-07 VITALS — BP 118/82 | HR 90 | Temp 98.4°F | Ht 68.0 in | Wt 342.0 lb

## 2016-06-07 DIAGNOSIS — G5712 Meralgia paresthetica, left lower limb: Secondary | ICD-10-CM | POA: Diagnosis not present

## 2016-06-07 DIAGNOSIS — Z7689 Persons encountering health services in other specified circumstances: Secondary | ICD-10-CM

## 2016-06-07 MED ORDER — GABAPENTIN 100 MG PO CAPS: 100.0000 mg | ORAL_CAPSULE | Freq: Three times a day (TID) | ORAL | 3 refills | Status: DC

## 2016-06-07 NOTE — Patient Instructions (Addendum)
It was nice meeting you today  You can now schedule an appointment anytime to come in for any concerns  Follow-up with me in 2 weeks   Neuropathic Pain Introduction Neuropathic pain is pain caused by damage to the nerves that are responsible for certain sensations in your body (sensory nerves). The pain can be caused by damage to:  The sensory nerves that send signals to your spinal cord and brain (peripheral nervous system).  The sensory nerves in your brain or spinal cord (central nervous system). Neuropathic pain can make you more sensitive to pain. What would be a minor sensation for most people may feel very painful if you have neuropathic pain. This is usually a long-term condition that can be difficult to treat. The type of pain can differ from person to person. It may start suddenly (acute), or it may develop slowly and last for a long time (chronic). Neuropathic pain may come and go as damaged nerves heal or may stay at the same level for years. It often causes emotional distress, loss of sleep, and a lower quality of life. What are the causes? The most common cause of damage to a sensory nerve is diabetes. Many other diseases and conditions can also cause neuropathic pain. Causes of neuropathic pain can be classified as:  Toxic. Many drugs and chemicals can cause toxic damage. The most common cause of toxic neuropathic pain is damage from drug treatment for cancer (chemotherapy).  Metabolic. This type of pain can happen when a disease causes imbalances that damage nerves. Diabetes is the most common of these diseases. Vitamin B deficiency caused by long-term alcohol abuse is another common cause.  Traumatic. Any injury that cuts, crushes, or stretches a nerve can cause damage and pain. A common example is feeling pain after losing an arm or leg (phantom limb pain).  Compression-related. If a sensory nerve gets trapped or compressed for a long period of time, the blood supply to the  nerve can be cut off.  Vascular. Many blood vessel diseases can cause neuropathic pain by decreasing blood supply and oxygen to nerves.  Autoimmune. This type of pain results from diseases in which the body's defense system mistakenly attacks sensory nerves. Examples of autoimmune diseases that can cause neuropathic pain include lupus and multiple sclerosis.  Infectious. Many types of viral infections can damage sensory nerves and cause pain. Shingles infection is a common cause of this type of pain.  Inherited. Neuropathic pain can be a symptom of many diseases that are passed down through families (genetic). What are the signs or symptoms? The main symptom is pain. Neuropathic pain is often described as:  Burning.  Shock-like.  Stinging.  Hot or cold.  Itching. How is this diagnosed? No single test can diagnose neuropathic pain. Your health care provider will do a physical exam and ask you about your pain. You may use a pain scale to describe how bad your pain is. You may also have tests to see if you have a high sensitivity to pain and to help find the cause and location of any sensory nerve damage. These tests may include:  Imaging studies, such as:  X-rays.  CT scan.  MRI.  Nerve conduction studies to test how well nerve signals travel through your sensory nerves (electrodiagnostic testing).  Stimulating your sensory nerves through electrodes on your skin and measuring the response in your spinal cord and brain (somatosensory evoked potentials). How is this treated? Treatment for neuropathic pain may change over  time. You may need to try different treatment options or a combination of treatments. Some options include:  Over-the-counter pain relievers.  Prescription medicines. Some medicines used to treat other conditions may also help neuropathic pain. These include medicines to:  Control seizures (anticonvulsants).  Relieve depression  (antidepressants).  Prescription-strength pain relievers (narcotics). These are usually used when other pain relievers do not help.  Transcutaneous nerve stimulation (TENS). This uses electrical currents to block painful nerve signals. The treatment is painless.  Topical and local anesthetics. These are medicines that numb the nerves. They can be injected as a nerve block or applied to the skin.  Alternative treatments, such as:  Acupuncture.  Meditation.  Massage.  Physical therapy.  Pain management programs.  Counseling. Follow these instructions at home:  Learn as much as you can about your condition.  Take medicines only as directed by your health care provider.  Work closely with all your health care providers to find what works best for you.  Have a good support system at home.  Consider joining a chronic pain support group. Contact a health care provider if:  Your pain treatments are not helping.  You are having side effects from your medicines.  You are struggling with fatigue, mood changes, depression, or anxiety. This information is not intended to replace advice given to you by your health care provider. Make sure you discuss any questions you have with your health care provider. Document Released: 02/24/2004 Document Revised: 12/17/2015 Document Reviewed: 11/06/2013  2017 Elsevier

## 2016-06-07 NOTE — Progress Notes (Signed)
Subjective: Chief Complaint  Patient presents with  . New Patient (Initial Visit)     HPI: Andre MalletQuentin T Reynolds is a 35 y.o. presenting to clinic today for a new patient visit to establish care. Has not been seen by Dr. since he was 35 years of age (>6851yrs ago). Her lab clinic per his wife and children who come to our clinic.   Acute concerns include: #Thigh Numbness Having left-sided numbness for several months. Has not followed up with any doctors about this issue. Has not taken any medications. States that pain starts in his left lateral thigh and works its way down to the foot. Pain worse when he stands or walks regularly for the rest. Occurs daily. Sometimes has associated swelling. Abnormal sensation to the lateral aspect of leg. Describes pain as throbbing and aching.   Past Medical History:  Diagnosis Date  . Asthma   . DIVERTICULITIS, HX OF 08/03/2009   Qualifier: Diagnosis of  By: Gilford Rileobbia MD, Luisa HartPatrick    . Pneumonia    Past Surgical History:  Procedure Laterality Date  . WISDOM TOOTH EXTRACTION     No current outpatient prescriptions on file prior to visit.   No current facility-administered medications on file prior to visit.    No Known Allergies  Family History  Problem Relation Age of Onset  . Diabetes Mother   . Alcohol abuse Mother   . Asthma Mother   . Cancer Mother     breast  . Hypertension Mother   . Diabetes Father   . Drug abuse Sister     Social History   Social History  . Marital status: Married    Spouse name: N/A  . Number of children: 6  . Years of education: N/A   Occupational History  . Not on file.   Social History Main Topics  . Smoking status: Current Every Day Smoker  . Smokeless tobacco: Never Used  . Alcohol use Yes  . Drug use:     Types: Marijuana  . Sexual activity: Yes   Other Topics Concern  . Not on file   Social History Narrative   Unemployed   Recently married     Health Maintenance Due  Topic Date Due    . INFLUENZA VACCINE  01/11/2016    ROS noted in HPI.  Past Medical, Surgical, Social, and Family History Reviewed & Updated per EMR.  Objective: BP 118/82   Pulse 90   Temp 98.4 F (36.9 C) (Oral)   Ht 5\' 8"  (1.727 m)   Wt (!) 342 lb (155.1 kg)   SpO2 95%   BMI 52.00 kg/m  Vitals and nursing notes reviewed  Physical Exam  Constitutional: He is oriented to person, place, and time and well-developed, well-nourished, and in no distress.  HENT:  Head: Normocephalic and atraumatic.  Right Ear: Tympanic membrane and external ear normal.  Left Ear: Tympanic membrane and external ear normal.  Mouth/Throat: Oropharynx is clear and moist.  Eyes: Conjunctivae and EOM are normal. Pupils are equal, round, and reactive to light.  Neck: Normal range of motion. Neck supple. No thyromegaly present.  Cardiovascular: Normal rate, regular rhythm, normal heart sounds and intact distal pulses.   Pulmonary/Chest: Effort normal and breath sounds normal.  Abdominal: Soft. Bowel sounds are normal. He exhibits no distension. There is no tenderness. There is no guarding.  Musculoskeletal: Normal range of motion. He exhibits tenderness. He exhibits no edema or deformity.  Tender to palpation of lateral  left thigh  Neurological: He is alert and oriented to person, place, and time. He has normal strength and normal reflexes. He displays no atrophy. A sensory deficit is present. No cranial nerve deficit. He exhibits normal muscle tone. He has a normal Straight Leg Raise Test.  Diminished sensation to lateral left thigh and lower leg.  Skin: Skin is warm and dry. No rash noted.  Psychiatric: Mood and affect normal.    No results found for this or any previous visit (from the past 72 hour(s)).  Assessment/Plan: Please see problem based Assessment and Plan   Meds ordered this encounter  Medications  . gabapentin (NEURONTIN) 100 MG capsule    Sig: Take 1 capsule (100 mg total) by mouth 3 (three) times  daily.    Dispense:  90 capsule    Refill:  3     Caryl AdaJazma Gabriella Guile, DO 06/07/2016, 10:04 AM PGY-3, [facility]

## 2016-06-07 NOTE — Assessment & Plan Note (Signed)
Classic presentation with symptoms of pain, paresthesia, and hypesthesia over the upper outer thigh. Discussed diagnosis with patient. Encouraged weight loss and avoiding tight garments. Will start patient on low dose of gabapentin to see if this helps with pain. Can increase if he tolerates. In future could consider some kind of local nerve block if not relieved with these conservative treatments.

## 2016-06-14 ENCOUNTER — Telehealth: Payer: Self-pay | Admitting: Obstetrics and Gynecology

## 2016-06-14 NOTE — Telephone Encounter (Signed)
Will forward to PCP.  Martin, Tamika L, RN  

## 2016-06-14 NOTE — Telephone Encounter (Signed)
Yes if medication is helping but not fully he can go to 3x a day. However please tell him to monitor for increased drowsiness. Also if medication not helping with groin pain he needs to come back in.

## 2016-06-14 NOTE — Telephone Encounter (Signed)
Pt is taking gabapentin once a day and would like the ok to move it up to three times a day. ep

## 2016-06-15 NOTE — Telephone Encounter (Signed)
Left message for patient to call clinic with any other concerns.  Ok for pt to increase gabapentin to TID, if pain is not relieved for patient to call for an appointment. Message also left for patient that increased gabapentin may cause increase in downiness.  Clovis PuMartin, Tamika L, RN

## 2016-08-22 ENCOUNTER — Ambulatory Visit: Payer: Medicaid Other | Admitting: Obstetrics and Gynecology

## 2016-08-31 ENCOUNTER — Ambulatory Visit: Payer: Medicaid Other | Admitting: Obstetrics and Gynecology

## 2016-09-05 ENCOUNTER — Ambulatory Visit (INDEPENDENT_AMBULATORY_CARE_PROVIDER_SITE_OTHER): Payer: Medicaid Other | Admitting: Obstetrics and Gynecology

## 2016-09-05 ENCOUNTER — Encounter: Payer: Self-pay | Admitting: Obstetrics and Gynecology

## 2016-09-05 VITALS — BP 110/68 | HR 87 | Temp 98.4°F | Ht 67.0 in | Wt 330.0 lb

## 2016-09-05 DIAGNOSIS — G5712 Meralgia paresthetica, left lower limb: Secondary | ICD-10-CM | POA: Diagnosis not present

## 2016-09-05 DIAGNOSIS — M67431 Ganglion, right wrist: Secondary | ICD-10-CM | POA: Diagnosis not present

## 2016-09-05 MED ORDER — GABAPENTIN 300 MG PO CAPS: 300.0000 mg | ORAL_CAPSULE | Freq: Three times a day (TID) | ORAL | 0 refills | Status: DC

## 2016-09-05 NOTE — Progress Notes (Signed)
    Subjective: Chief Complaint  Patient presents with  . Cyst    Right wrist      HPI: Andre Reynolds is a 36 y.o. presenting to clinic today to discuss the following:  #Cyst  New cyst appeared on right wrist Present for about 7-8 months but getting larger No pain Denies any injury to wrist Wants cyst removed  # Thigh numbness Continues to have Gabapentin helps; needs refill Worse at night and with prolonged standing Denies wearing tight clothes Denies groan pain paresthesia just on lateral side of leg no radiating symtpoms  ROS noted in HPI.   Past Medical, Surgical, Social, and Family History Reviewed & Updated per EMR.   Pertinent Historical Findings include: None   History  Smoking Status  . Current Every Day Smoker  Smokeless Tobacco  . Never Used     Objective: BP 110/68   Pulse 87   Temp 98.4 F (36.9 C) (Oral)   Ht 5\' 7"  (1.702 m)   Wt (!) 330 lb (149.7 kg)   SpO2 97%   BMI 51.69 kg/m  Vitals and nursing notes reviewed  Physical Exam  Constitutional: He is well-developed, well-nourished, and in no distress.  Obese   Cardiovascular: Normal rate, regular rhythm and normal heart sounds.   Pulmonary/Chest: Effort normal and breath sounds normal.  Musculoskeletal:  Right wrist with ganglion cyst (1cm) on plantar aspect.  Tender to palpation of lateral left thigh  Neurological: He is alert and oriented to person, place, and time. He has normal strength and normal reflexes. He displays no atrophy. A sensory deficit is present. No cranial nerve deficit. He exhibits normal muscle tone. He has a normal Straight Leg Raise Test.  Diminished sensation to lateral left thigh.   Procedure Note: A timeout protocol was performed prior to initiating the procedure. The affected area (R. Wrist) was cleaned in a sterile fashion. Anesthesia was achieved using 1 mL of 1% Lidocaine injected around the area using a 25-guage 1.5 inch needle. An 18 gauge needle was  inserted into cyst wall. No material was able to be aspirated. Bleeding was minimal. A sterile dressing was applied to the area. The patient tolerated the procedure well.   Assessment/Plan: Please see problem based Assessment and Plan PATIENT EDUCATION PROVIDED: See AVS    Diagnosis and plan were discussed in detail with this patient today. The patient verbalized understanding and agreed with the plan.    Orders Placed This Encounter  Procedures  . Ambulatory referral to Hand Surgery    Referral Priority:   Routine    Referral Type:   Surgical    Referral Reason:   Specialty Services Required    Requested Specialty:   Hand Surgery    Number of Visits Requested:   1    Meds ordered this encounter  Medications  . gabapentin (NEURONTIN) 300 MG capsule    Sig: Take 1 capsule (300 mg total) by mouth 3 (three) times daily.    Dispense:  90 capsule    Refill:  0     Caryl AdaJazma Phelps, DO 09/05/2016, 2:15 PM PGY-3, [facility]

## 2016-09-05 NOTE — Patient Instructions (Signed)
Ganglion Cyst A ganglion cyst is a noncancerous, fluid-filled lump that occurs near joints or tendons. The ganglion cyst grows out of a joint or the lining of a tendon. It most often develops in the hand or wrist, but it can also develop in the shoulder, elbow, hip, knee, ankle, or foot. The round or oval ganglion cyst can be the size of a pea or larger than a grape. Increased activity may enlarge the size of the cyst because more fluid starts to build up. What are the causes? It is not known what causes a ganglion cyst to grow. However, it may be related to:  Inflammation or irritation around the joint.  An injury.  Repetitive movements or overuse.  Arthritis. What increases the risk? Risk factors include:  Being a woman.  Being age 20-50. What are the signs or symptoms? Symptoms may include:  A lump. This most often appears on the hand or wrist, but it can occur in other areas of the body.  Tingling.  Pain.  Numbness.  Muscle weakness.  Weak grip.  Less movement in a joint. How is this diagnosed? Ganglion cysts are most often diagnosed based on a physical exam. Your health care provider will feel the lump and may shine a light alongside it. If it is a ganglion cyst, a light often shines through it. Your health care provider may order an X-ray, ultrasound, or MRI to rule out other conditions. How is this treated? Ganglion cysts usually go away on their own without treatment. If pain or other symptoms are involved, treatment may be needed. Treatment is also needed if the ganglion cyst limits your movement or if it gets infected. Treatment may include:  Wearing a brace or splint on your wrist or finger.  Taking anti-inflammatory medicine.  Draining fluid from the lump with a needle (aspiration).  Injecting a steroid into the joint.  Surgery to remove the ganglion cyst. Follow these instructions at home:  Do not press on the ganglion cyst, poke it with a needle, or hit  it.  Take medicines only as directed by your health care provider.  Wear your brace or splint as directed by your health care provider.  Watch your ganglion cyst for any changes.  Keep all follow-up visits as directed by your health care provider. This is important. Contact a health care provider if:  Your ganglion cyst becomes larger or more painful.  You have increased redness, red streaks, or swelling.  You have pus coming from the lump.  You have weakness or numbness in the affected area.  You have a fever or chills. This information is not intended to replace advice given to you by your health care provider. Make sure you discuss any questions you have with your health care provider. Document Released: 05/26/2000 Document Revised: 11/04/2015 Document Reviewed: 11/11/2013 Elsevier Interactive Patient Education  2017 Elsevier Inc.  

## 2016-09-11 DIAGNOSIS — M67431 Ganglion, right wrist: Secondary | ICD-10-CM | POA: Insufficient documentation

## 2016-09-11 NOTE — Assessment & Plan Note (Signed)
Symptoms consistent still with diagnosis. Patient to continue working on weight loss. Refilled gabapentin as this helps with pain. Need to consider further work-up such as back imaging or SM referral if patient with persistent uncontrolled symptoms.

## 2016-09-11 NOTE — Assessment & Plan Note (Signed)
Referral placed to hand surgeon for removal of ganglion cyst per patient request. Unable to aspirate any material today.

## 2016-09-19 ENCOUNTER — Ambulatory Visit (INDEPENDENT_AMBULATORY_CARE_PROVIDER_SITE_OTHER): Payer: Medicaid Other | Admitting: Orthopaedic Surgery

## 2016-09-19 ENCOUNTER — Ambulatory Visit (INDEPENDENT_AMBULATORY_CARE_PROVIDER_SITE_OTHER): Payer: Medicaid Other

## 2016-09-19 DIAGNOSIS — M67431 Ganglion, right wrist: Secondary | ICD-10-CM

## 2016-09-19 NOTE — Progress Notes (Signed)
Office Visit Note   Patient: Andre Reynolds           Date of Birth: 31-Aug-1980           MRN: [id number] Visit Date: 09/19/2016              Requested by: [name], DO [address] [place], [place] [address] PCP: Caryl Ada, DO   Assessment & Plan: Visit Diagnoses:  1. Ganglion cyst of wrist, right     Plan: MRI with contrast to fully evaluate the cyst. Follow-up after the MRI  Follow-Up Instructions: Return in about 10 days (around 09/29/2016).   Orders:  Orders Placed This Encounter  Procedures  . XR Wrist Complete Right  . MR Wrist Right w/ contrast   No orders of the defined types were placed in this encounter.     Procedures: No procedures performed   Clinical Data: No additional findings.   Subjective: Chief Complaint  Patient presents with  . Right Wrist - Pain, Cyst    Patient is a 36 year old gentleman with a volar right cyst for about a year. It fluctuates in size. He denies any constitutional symptoms or injuries. His primary care doctor did attempt aspiration but was unable to obtain any fluid. This continues to bother him especially with activity and use of the hand.    Review of Systems  Constitutional: Negative.   All other systems reviewed and are negative.    Objective: Vital Signs: There were no vitals taken for this visit.  Physical Exam  Constitutional: He is oriented to person, place, and time. He appears well-developed and well-nourished.  HENT:  Head: Normocephalic and atraumatic.  Eyes: Pupils are equal, round, and reactive to light.  Neck: Neck supple.  Pulmonary/Chest: Effort normal.  Abdominal: Soft.  Musculoskeletal: Normal range of motion.  Neurological: He is alert and oriented to person, place, and time.  Skin: Skin is warm.  Psychiatric: He has a normal mood and affect. His behavior is normal. Judgment and thought content normal.  Nursing note and vitals reviewed.   Ortho Exam Right wrist exam  shows a small firm semimobile cyst directly over the FCR tendon at the proximal wrist crease. He has a strong radial pulse. There is no skin changes. Specialty Comments:  No specialty comments available.  Imaging: Xr Wrist Complete Right  Result Date: 09/19/2016 No acute or bony abnormalities    PMFS History: Patient Active Problem List   Diagnosis Date Noted  . Ganglion cyst of wrist, right 09/11/2016  . Meralgia paresthetica, left 06/07/2016  . Encounter to establish care with new doctor 06/07/2016   Past Medical History:  Diagnosis Date  . Asthma   . DIVERTICULITIS, HX OF 08/03/2009   Qualifier: Diagnosis of  By: Gilford Rile MD, Luisa Hart    . Pneumonia     Family History  Problem Relation Age of Onset  . Diabetes Mother   . Alcohol abuse Mother   . Asthma Mother   . Cancer Mother     breast  . Hypertension Mother   . Diabetes Father   . Drug abuse Sister     Past Surgical History:  Procedure Laterality Date  . WISDOM TOOTH EXTRACTION     Social History   Occupational History  . Not on file.   Social History Main Topics  . Smoking status: Current Every Day Smoker  . Smokeless tobacco: Never Used  . Alcohol use Yes  . Drug use: Yes  Types: Marijuana  . Sexual activity: Yes

## 2016-09-20 ENCOUNTER — Emergency Department (HOSPITAL_COMMUNITY)
Admission: EM | Admit: 2016-09-20 | Discharge: 2016-09-20 | Disposition: A | Discharge status: Home / Self Care | Payer: Medicaid Other | Attending: Emergency Medicine | Admitting: Emergency Medicine

## 2016-09-20 ENCOUNTER — Telehealth (INDEPENDENT_AMBULATORY_CARE_PROVIDER_SITE_OTHER): Payer: Self-pay | Admitting: *Deleted

## 2016-09-20 ENCOUNTER — Encounter (HOSPITAL_COMMUNITY): Payer: Self-pay | Admitting: *Deleted

## 2016-09-20 DIAGNOSIS — Z5321 Procedure and treatment not carried out due to patient leaving prior to being seen by health care provider: Secondary | ICD-10-CM | POA: Diagnosis not present

## 2016-09-20 DIAGNOSIS — R1032 Left lower quadrant pain: Secondary | ICD-10-CM | POA: Insufficient documentation

## 2016-09-20 NOTE — ED Provider Notes (Signed)
It appears patient left without being seen by a provider. I did not see patient.   Marily Memos, MD 09/20/16 [phone]

## 2016-09-20 NOTE — Telephone Encounter (Signed)
No arthrogram just normal MRI witout contrast

## 2016-09-20 NOTE — Telephone Encounter (Signed)
Andre Reynolds from [facility] imaging called wants to know if the order needs to be with arthrogram of wrist or with and without, or just without, states never does MRI with unless looking for mass or had a previous CT/MRI with/without. PLease advise.

## 2016-09-20 NOTE — ED Triage Notes (Signed)
Per EMS, pt complains of LLQ pain radiating to back since 330AM today, tender to palpation. Pt has hx of bowel obstruction.

## 2016-09-21 NOTE — Telephone Encounter (Signed)
Noted order changed

## 2016-09-21 NOTE — Addendum Note (Signed)
Addended by: Elvina Mattes T on: 09/21/2016 09:55 AM   Modules accepted: Orders

## 2016-09-22 ENCOUNTER — Ambulatory Visit (INDEPENDENT_AMBULATORY_CARE_PROVIDER_SITE_OTHER): Payer: Medicaid Other | Admitting: Family Medicine

## 2016-09-22 ENCOUNTER — Encounter: Payer: Self-pay | Admitting: Family Medicine

## 2016-09-22 VITALS — BP 120/90 | HR 132 | Temp 98.8°F | Ht 69.0 in | Wt 316.0 lb

## 2016-09-22 DIAGNOSIS — K5732 Diverticulitis of large intestine without perforation or abscess without bleeding: Secondary | ICD-10-CM | POA: Diagnosis present

## 2016-09-22 MED ORDER — IBUPROFEN 600 MG PO TABS: 600.0000 mg | ORAL_TABLET | Freq: Four times a day (QID) | ORAL | 0 refills | Status: DC | PRN

## 2016-09-22 MED ORDER — CIPROFLOXACIN HCL 500 MG PO TABS: 500.0000 mg | ORAL_TABLET | Freq: Two times a day (BID) | ORAL | 0 refills | Status: AC

## 2016-09-22 MED ORDER — PSYLLIUM 0.52 G PO CAPS: 0.5200 g | ORAL_CAPSULE | Freq: Three times a day (TID) | ORAL | 1 refills | Status: DC

## 2016-09-22 MED ORDER — TRAMADOL HCL 50 MG PO TABS: 50.0000 mg | ORAL_TABLET | Freq: Three times a day (TID) | ORAL | 0 refills | Status: DC | PRN

## 2016-09-22 MED ORDER — PROMETHAZINE HCL 25 MG RE SUPP: 25.0000 mg | Freq: Four times a day (QID) | RECTAL | 0 refills | Status: DC | PRN

## 2016-09-22 MED ORDER — METRONIDAZOLE 500 MG PO TABS: 500.0000 mg | ORAL_TABLET | Freq: Three times a day (TID) | ORAL | 0 refills | Status: DC

## 2016-09-22 MED ORDER — ACETAMINOPHEN 500 MG PO TABS: 500.0000 mg | ORAL_TABLET | Freq: Four times a day (QID) | ORAL | 0 refills | Status: DC | PRN

## 2016-09-22 NOTE — Patient Instructions (Signed)
It was great seeing you today! We have addressed the following issues today  1. I will prescribe some antibiotics Cipro and Flagyl you will take as prescribed for a week. 2. I will prescribe tramadol for pain relief in addition to ibuprofen and tylenol 3. I will also prescribe some anti nausea medications 4. I will also refer you to Gastroenterology. Please if symptoms worsened please go to the ED because you might need imaging and further studies.  If we did any lab work today, and the results require attention, either me or my nurse will get in touch with you. If everything is normal, you will get a letter in mail and a message via . If you don't hear from [place] in two weeks, please give [place] a call. Otherwise, we look forward to seeing you again at your next visit. If you have any questions or concerns before then, please call the clinic at [phone].  Please bring all your medications to every doctors visit  Sign up for My Chart to have easy access to your labs results, and communication with your Primary care physician.    Please check-out at the front desk before leaving the clinic.    Take Care,   Dr. Sydnee Cabal

## 2016-09-23 MED ORDER — PROMETHAZINE HCL 25 MG RE SUPP: 25.0000 mg | Freq: Four times a day (QID) | RECTAL | 0 refills | Status: DC | PRN

## 2016-09-23 NOTE — Addendum Note (Signed)
Addended by: Lovena Neighbours F on: 09/23/2016 12:05 PM   Modules accepted: Orders

## 2016-09-23 NOTE — Progress Notes (Signed)
Date of Visit: 09/22/2016   HPI:  Patient presents for a same day appointment to discuss left lower abdominal pain. Patient reports that symptoms started on Wednesday, sharp 10/10 pain on his left lower abdomen initially radiating to his RLQ but then localized mostly to his LLQ. Patient reports some nausea and one episode of NBNB emesis on Thursday evening associated with pain. Patient has had no appetite and only able to hydrate himself. For the past two days. Patient went to the ED on Wednesday evening, however he left before being seen by a provider. Patient has been taking tylenol pm for his pain with no change in his symptoms. Patient denies fever, chills, diarrhea, melena, of hematemesis. Patient denies shortness of breath, chest pain. Patient had similar experience back in 2011 and hospitalized for about a month and was diagnosis with diverticulitis. Patient was referred to GI but was not able to make his appointment due to a lack of insurance. Patient has not made any significant changes to his diet since then.  ROS: See HPI  PMFSH:  Past Medical History:  Diagnosis Date  . Asthma   . DIVERTICULITIS, HX OF 08/03/2009   Qualifier: Diagnosis of  By: Gilford Rile MD, Luisa Hart    . Pneumonia    Past Surgical History:  Procedure Laterality Date  . WISDOM TOOTH EXTRACTION      PHYSICAL EXAM: BP 120/90 (BP Location: Left Arm, Patient Position: Sitting, Cuff Size: Large)   Pulse (!) 132   Temp 98.8 F (37.1 C) (Oral)   Ht  (1.753 m)   Wt (!) 316 lb (143.3 kg)   SpO2 98%   BMI 46.67 kg/m   General: Patient shows some discomfort but is in no acute distress, able to participate in exam Cardiac: RRR, normal heart sounds, no murmurs. 2+ radial and PT pulses bilaterally Respiratory: CTAB, normal effort, No wheezes, rales or rhonchi Abdomen: soft, LLQ tenderness with gentle palpation, no erythema, or overt sign of infection nondistended, no hepatic or splenomegaly, +BS, no guarding, no  rebounding tenderness, all other quadrant non tender to palpation. Extremities: no edema or cyanosis. WWP. Skin: warm and dry, no rashes noted Neuro: alert and oriented x4, no focal deficits Psych: Normal affect and mood  ASSESSMENT/PLAN:  #LLQ pain, acute Patient initial presentation of severe LLQ pain similar to past diverticulitis episode. Patient was never seen by GI when initial refer and has had no follow up. Symptoms are consistent with diverticulitis flair up, given location, severity of pain and poor diet and continued history of constipation. Differential would include appendicitis, mesenteric ischemia, diverticulosis. But given symptoms presentation, age and patient medical history and acuity less likely. Given patient age and recurrence of his symptoms patient will need GI referral. --Will prescribe Flagyl 500 mg TID for 7 days and Cipro 500 mg bid for 7 days  --Start patient on Phenergan for nausea --Alternate ibuprofen and tylenol for pain --Tramadol 50 mg for breakthrough pain --Metamucil to improve GI transit --Will refer patient to GI for further evaluation --Return precautions given  FOLLOW UP: Follow up in the ED if symptoms do not improve or worsen in the next two days.  Lovena Neighbours, MD [facility]

## 2016-09-26 ENCOUNTER — Encounter: Payer: Self-pay | Admitting: Gastroenterology

## 2016-09-28 ENCOUNTER — Ambulatory Visit (INDEPENDENT_AMBULATORY_CARE_PROVIDER_SITE_OTHER): Payer: Medicaid Other | Admitting: Orthopaedic Surgery

## 2016-10-02 ENCOUNTER — Telehealth (INDEPENDENT_AMBULATORY_CARE_PROVIDER_SITE_OTHER): Payer: Self-pay | Admitting: Orthopaedic Surgery

## 2016-10-02 DIAGNOSIS — M25531 Pain in right wrist: Secondary | ICD-10-CM

## 2016-10-02 NOTE — Telephone Encounter (Signed)
Please advise 

## 2016-10-02 NOTE — Telephone Encounter (Signed)
Pt wife requested a call back regarding pt MRI denial, wants to know what to do next since the MRI was denied.   [phone]

## 2016-10-02 NOTE — Telephone Encounter (Signed)
Refer to Hydrographic surveyor. Dr. Mack Hook at [facility]

## 2016-10-03 NOTE — Addendum Note (Signed)
Addended by: Albertina Parr on: 10/03/2016 10:25 AM   Modules accepted: Orders

## 2016-10-03 NOTE — Telephone Encounter (Signed)
Order made. Called to let her know, she and pt is aware.

## 2016-10-06 ENCOUNTER — Other Ambulatory Visit: Payer: Medicaid Other

## 2016-10-09 ENCOUNTER — Ambulatory Visit (INDEPENDENT_AMBULATORY_CARE_PROVIDER_SITE_OTHER): Payer: Medicaid Other | Admitting: Orthopaedic Surgery

## 2016-10-16 ENCOUNTER — Other Ambulatory Visit: Payer: Self-pay | Admitting: Obstetrics and Gynecology

## 2016-10-17 DIAGNOSIS — R2231 Localized swelling, mass and lump, right upper limb: Secondary | ICD-10-CM | POA: Diagnosis not present

## 2016-10-24 ENCOUNTER — Encounter: Payer: Self-pay | Admitting: Gastroenterology

## 2016-10-24 ENCOUNTER — Ambulatory Visit (INDEPENDENT_AMBULATORY_CARE_PROVIDER_SITE_OTHER): Payer: Medicaid Other | Admitting: Gastroenterology

## 2016-10-24 VITALS — BP 130/88 | HR 120 | Ht 69.0 in | Wt 322.0 lb

## 2016-10-24 DIAGNOSIS — K5792 Diverticulitis of intestine, part unspecified, without perforation or abscess without bleeding: Secondary | ICD-10-CM

## 2016-10-24 NOTE — Progress Notes (Signed)
Andre Reynolds:  History: Andre MalletQuentin T Reynolds 10/24/2016  Referring physician: Pincus LargePhelps, Jazma Y, Reynolds  Reason for consult/chief complaint: Abdominal Pain (left sided abdominal pain x 1 month ago; was in the hospital); Constipation (was recently dx with diverticulitis; was given antibiotics and is now feeling better); and Emesis   Subjective  HPI:  This is a 36 year old man referred by primary care for recent episode of diverticulitis. He reports an episode in February 2011, and I found a CT scan report confirming that. He was apparently hospitalized for that episode. About a month ago he had recurrence of similar symptoms with left lower quadrant pain and constipation. He went to the ED on 09/20/2016: Left after a few hours without being seen. He saw his PCP on April 13, was diagnosed with probable diverticulitis, and given 1 week of ciprofloxacin and Flagyl. He was also advised to take daily fiber supplement and drink more water. His pain resolved during the antibiotic course, and he has continued to take the fiber and water is recommended. There has been no rectal bleeding. Andre MandesQuentin is recovering from a recent surgery for a right wrist ganglion cyst.  ROS:  Review of Systems  Constitutional: Negative for appetite change and unexpected weight change.  HENT: Negative for mouth sores and voice change.   Eyes: Negative for pain and redness.  Respiratory: Negative for cough and shortness of breath.   Cardiovascular: Negative for chest pain and palpitations.  Genitourinary: Negative for dysuria and hematuria.  Musculoskeletal: Negative for arthralgias and myalgias.  Skin: Negative for pallor and rash.  Neurological: Positive for headaches. Negative for weakness.  Hematological: Negative for adenopathy.  Psychiatric/Behavioral: Positive for dysphoric mood.     Past Medical History: Past Medical History:  Diagnosis Date  . Asthma   . DIVERTICULITIS, HX OF  08/03/2009   Qualifier: Diagnosis of  By: Andre Reynolds    . Pneumonia      Past Surgical History: Past Surgical History:  Procedure Laterality Date  . WISDOM TOOTH EXTRACTION    . WRIST SURGERY       Family History: Family History  Problem Relation Age of Onset  . Diabetes Mother   . Alcohol abuse Mother   . Asthma Mother   . Hypertension Mother   . Breast cancer Mother   . Diabetes Father   . Drug abuse Sister   . Diabetes Maternal Grandmother   . Diabetes Maternal Aunt   . Diabetes Cousin        maternal cousin    Social History: Social History   Social History  . Marital status: Married    Spouse name: N/A  . Number of children: 4  . Years of education: N/A   Social History Main Topics  . Smoking status: Current Every Day Smoker  . Smokeless tobacco: Never Used  . Alcohol use Yes  . Drug use: Yes    Types: Marijuana  . Sexual activity: Yes    Partners: Female   Other Topics Concern  . None   Social History Narrative   Unemployed      Recently married     Allergies: No Known Allergies  Outpatient Meds: Current Outpatient Prescriptions  Medication Sig Dispense Refill  . acetaminophen (TYLENOL) 500 MG tablet Take 1 tablet (500 mg total) by mouth every 6 (six) hours as needed. 30 tablet 0  . gabapentin (NEURONTIN) 300 MG capsule Take 1 capsule (300 mg total) by mouth 2 (two) times daily. 90  capsule 0  . ibuprofen (ADVIL,MOTRIN) 600 MG tablet Take 1 tablet (600 mg total) by mouth every 6 (six) hours as needed for mild pain or moderate pain. 30 tablet 0  . traMADol (ULTRAM) 50 MG tablet Take 1 tablet (50 mg total) by mouth every 8 (eight) hours as needed. 15 tablet 0   No current facility-administered medications for this visit.       ___________________________________________________________________ Objective   Exam:  BP 130/88   Pulse (!) 120   Ht 5\' 9"  (1.753 m)   Wt (!) 322 lb (146.1 kg)   BMI 47.55 kg/m    General: this is  a(n) Morbidly obese man   Eyes: sclera anicteric, no redness  ENT: oral mucosa moist without lesions, no cervical or supraclavicular lymphadenopathy, good dentition  CV: RRR without murmur, S1/S2, no JVD, no peripheral edema  Resp: clear to auscultation bilaterally, normal RR and effort noted  GI: soft, no tenderness, with active bowel sounds. No guarding or palpable organomegaly noted.  Skin; warm and dry, no rash or jaundice noted  Neuro: awake, alert and oriented x 3. Normal gross motor function and fluent speech His right wrist is in a cast Labs: No recent data or imaging to review.  CT scan report from February 2011 reveals sigmoid diverticulitis without abscess or perforation  Assessment: Encounter Diagnosis  Name Primary?  . Acute diverticulitis Yes    He has a recent episode of diverticulitis that improved with a short course of antibiotics. This is his second episode since the initial episode in 2011. I Reynolds not think he needs a colonoscopy or surgical consultation.  Plan:  Continue fiber and water for treatment of chronic constipation and follow up with me as needed. He was given my card  Thank you for the courtesy of this consult.  Please call me with any questions or concerns.  Andre Reynolds  CC: Andre Large, Reynolds

## 2016-10-24 NOTE — Patient Instructions (Addendum)
You have been given a separate informational sheet regarding your tobacco use, the importance of quitting and local resources to help you quit.  If you are age 38 or older, your body mass index should be between 23-30. Your Body mass index is 47.55 kg/m. If this is out of the aforementioned range listed, please consider follow up with your Primary Care Provider.  If you are age 38 or younger, your body mass index should be between 19-25. Your Body mass index is 47.55 kg/m. If this is out of the aformentioned range listed, please consider follow up with your Primary Care Provider.   Thank you for choosing  GI  Dr Amada JupiterHenry Danis III

## 2016-11-02 DIAGNOSIS — R2231 Localized swelling, mass and lump, right upper limb: Secondary | ICD-10-CM | POA: Diagnosis not present

## 2017-02-08 ENCOUNTER — Ambulatory Visit: Payer: Medicaid Other | Admitting: Internal Medicine

## 2017-02-19 DIAGNOSIS — K5730 Diverticulosis of large intestine without perforation or abscess without bleeding: Secondary | ICD-10-CM | POA: Insufficient documentation

## 2017-02-19 DIAGNOSIS — F17200 Nicotine dependence, unspecified, uncomplicated: Secondary | ICD-10-CM | POA: Insufficient documentation

## 2017-02-19 NOTE — Progress Notes (Deleted)
   [facility] Phone: [phone]   Date of Visit: 02/20/2017   HPI:  ***  ROS: See HPI.  PMFSH:  PMH: Obesity Diverticulosis Tobacco Use  PHYSICAL EXAM: There were no vitals taken for this visit. Gen: *** HEENT: *** Heart: *** Lungs: *** Neuro: *** Ext: ***  ASSESSMENT/PLAN:  Health maintenance:  -***  No problem-specific Assessment & Plan notes found for this encounter.  FOLLOW UP: Follow up in *** for ***  Palma HolterKanishka G Bijan Ridgley, MD PGY 2 [facility]

## 2017-02-20 ENCOUNTER — Ambulatory Visit: Payer: Medicaid Other | Admitting: Internal Medicine

## 2017-03-11 NOTE — Progress Notes (Deleted)
   [facility] Phone: [phone]   Date of Visit: 03/12/2017   HPI:  ***  ROS: See HPI.  PMFSH:  PMH: Diverticulosis Colon Severe Obesity Tobacco Use Meralgia Paresthetica  PHYSICAL EXAM: There were no vitals taken for this visit. Gen: *** HEENT: *** Heart: *** Lungs: *** Neuro: *** Ext: ***  ASSESSMENT/PLAN:  Health maintenance:  -***  No problem-specific Assessment & Plan notes found for this encounter.  FOLLOW UP: Follow up in *** for ***  Palma Holter, MD PGY 2 [facility]

## 2017-03-12 ENCOUNTER — Ambulatory Visit: Payer: Medicaid Other | Admitting: Internal Medicine

## 2017-06-21 ENCOUNTER — Telehealth: Payer: Self-pay | Admitting: Family Medicine

## 2017-06-21 NOTE — Telephone Encounter (Signed)
**  After Hours/ Emergency Line Call*  Received a call to report that Andre Reynolds is experiencing "excrutiating" pain related to diverticulitis.  Endorsing vomiting as well. Requesting a prescription for pain medication and nausea medication be called in to pharmacy. Recommended that patient be evaluated in the ED as there could be other things going on including bowel perforation. Patient's wife agrees. Will forward to PCP.  Tillman SersAngela C Riccio, DO PGY-2, [facility]

## 2017-06-30 ENCOUNTER — Encounter (HOSPITAL_COMMUNITY): Payer: Self-pay | Admitting: *Deleted

## 2017-06-30 ENCOUNTER — Other Ambulatory Visit: Payer: Self-pay

## 2017-06-30 ENCOUNTER — Emergency Department (HOSPITAL_COMMUNITY)
Admission: EM | Admit: 2017-06-30 | Discharge: 2017-06-30 | Disposition: A | Discharge status: Home / Self Care | Payer: Medicaid Other | Attending: Emergency Medicine | Admitting: Emergency Medicine

## 2017-06-30 DIAGNOSIS — B308 Other viral conjunctivitis: Secondary | ICD-10-CM | POA: Insufficient documentation

## 2017-06-30 DIAGNOSIS — H04203 Unspecified epiphora, bilateral lacrimal glands: Secondary | ICD-10-CM

## 2017-06-30 DIAGNOSIS — Z79899 Other long term (current) drug therapy: Secondary | ICD-10-CM | POA: Insufficient documentation

## 2017-06-30 DIAGNOSIS — J45909 Unspecified asthma, uncomplicated: Secondary | ICD-10-CM | POA: Insufficient documentation

## 2017-06-30 DIAGNOSIS — F17200 Nicotine dependence, unspecified, uncomplicated: Secondary | ICD-10-CM | POA: Insufficient documentation

## 2017-06-30 DIAGNOSIS — H61892 Other specified disorders of left external ear: Secondary | ICD-10-CM | POA: Insufficient documentation

## 2017-06-30 DIAGNOSIS — I10 Essential (primary) hypertension: Secondary | ICD-10-CM | POA: Diagnosis present

## 2017-06-30 MED ORDER — NAPHAZOLINE-PHENIRAMINE 0.025-0.3 % OP SOLN: 1.0000 [drp] | Freq: Four times a day (QID) | OPHTHALMIC | 0 refills | Status: DC | PRN

## 2017-06-30 NOTE — ED Triage Notes (Signed)
To ED for eval of HTN on multiple occasions that past couple of days. States he has had spider bites so he's call EMS who come to home - on vs check pt has had htn. 1st check was last Saturday and was told it was 140/90. 2nd check was yesterday afternoon 200/p. 3rd check was this am 132/109. Pt is without neuro deficits. Denies HA at present. Endorses HA last night. Pt states he has never been told he has htn. No wounds noted from spider bites- pt states he feels a buzzing in left ear.

## 2017-06-30 NOTE — ED Provider Notes (Signed)
[facility] Provider Note   CSN: [id number] Arrival date & time: 06/30/17  [id number]     History   Chief Complaint Chief Complaint  Patient presents with  . Hypertension    HPI Andre MalletQuentin T Bones is a 37 y.o. male with a past medical history of asthma, who presents to ED for evaluation of multiple complaints.  His first complaint is foreign body sensation in left ear.  He severed from several spider bite several days ago and feels like his left ear is irritated with a possible foreign body.  Denies any hearing changes or ear discharge. His next complaint is bilateral clear eye drainage for the past 2 days.  States that he woke up today with getting progressively worse.  He denies any conjunctival injection, trouble with EOMs, eye swelling, purulent discharge, sick contacts with similar symptoms.  He denies any contact lens use.  He denies any vision changes. His next complaint is high blood pressure.  EMS evaluated him for the spider bites about 2 days ago and was told that he had a systolic blood pressure of 200s.  He then checked it at home where it was 130s systolic.  He wanted to come here to the ED to be evaluated for this.  He denies any chest pain, headache, vision changes or shortness of breath.  No previous history of hypertension in the past.  HPI  Past Medical History:  Diagnosis Date  . Asthma   . DIVERTICULITIS, HX OF 08/03/2009   Qualifier: Diagnosis of  By: Gilford Rileobbia MD, Luisa HartPatrick    . Pneumonia     Patient Active Problem List   Diagnosis Date Noted  . Severe obesity (BMI >= 40) (HCC) 02/19/2017  . Diverticulosis of colon 02/19/2017  . Tobacco use disorder 02/19/2017  . Meralgia paresthetica, left 06/07/2016    Past Surgical History:  Procedure Laterality Date  . WISDOM TOOTH EXTRACTION    . WRIST SURGERY         Home Medications    Prior to Admission medications   Medication Sig Start Date End Date Taking? Authorizing  Provider  acetaminophen (TYLENOL) 500 MG tablet Take 1 tablet (500 mg total) by mouth every 6 (six) hours as needed. 09/22/16   Diallo, Lilia ArgueAbdoulaye, MD  gabapentin (NEURONTIN) 300 MG capsule Take 1 capsule (300 mg total) by mouth 2 (two) times daily. 10/18/16   Pincus LargePhelps, Jazma Y, DO  ibuprofen (ADVIL,MOTRIN) 600 MG tablet Take 1 tablet (600 mg total) by mouth every 6 (six) hours as needed for mild pain or moderate pain. 09/22/16   Diallo, Lilia ArgueAbdoulaye, MD  traMADol (ULTRAM) 50 MG tablet Take 1 tablet (50 mg total) by mouth every 8 (eight) hours as needed. 09/22/16   Lovena Neighboursiallo, Abdoulaye, MD    Family History Family History  Problem Relation Age of Onset  . Diabetes Mother   . Alcohol abuse Mother   . Asthma Mother   . Hypertension Mother   . Breast cancer Mother   . Diabetes Father   . Drug abuse Sister   . Diabetes Maternal Grandmother   . Diabetes Maternal Aunt   . Diabetes Cousin        maternal cousin    Social History Social History   Tobacco Use  . Smoking status: Current Every Day Smoker  . Smokeless tobacco: Never Used  Substance Use Topics  . Alcohol use: Yes  . Drug use: Yes    Types: Marijuana     Allergies  Patient has no known allergies.   Review of Systems Review of Systems  Constitutional: Negative for appetite change, chills and fever.  HENT: Negative for ear pain, rhinorrhea, sneezing and sore throat.        + Foreign body sensation of ear  Eyes: Positive for discharge. Negative for photophobia, pain, redness and visual disturbance.  Respiratory: Negative for cough, chest tightness, shortness of breath and wheezing.   Cardiovascular: Negative for chest pain and palpitations.  Gastrointestinal: Negative for abdominal pain, blood in stool, constipation, diarrhea, nausea and vomiting.  Genitourinary: Negative for dysuria, hematuria and urgency.  Musculoskeletal: Negative for myalgias.  Skin: Negative for rash.  Neurological: Negative for dizziness, weakness and  light-headedness.     Physical Exam Updated Vital Signs BP 126/86 (BP Location: Right Arm)   Pulse 89   Temp 98.2 F (36.8 C) (Oral)   Resp 16   SpO2 100%   Physical Exam  Constitutional: He appears well-developed and well-nourished. No distress.  Nontoxic appearing and in no acute distress.  Speaking in complete sentences without difficulty.  HENT:  Head: Normocephalic and atraumatic.  Right Ear: Tympanic membrane normal.  Left Ear: Tympanic membrane normal.  Nose: Nose normal.  No foreign bodies visualized on examination of ear canals bilaterally.  Eyes: Conjunctivae and EOM are normal. Pupils are equal, round, and reactive to light. Right eye exhibits discharge. Left eye exhibits discharge. No scleral icterus.  Bilateral eyes with clear tearing noted. No injected conjunctiva, no eyelid swelling or erythema or tenderness to palpation. No foreign bodies noted.  No pain with EOMs.  No chemosis, proptosis, or consensual photophobia.  Neck: Normal range of motion. Neck supple.  Cardiovascular: Normal rate, regular rhythm, normal heart sounds and intact distal pulses. Exam reveals no gallop and no friction rub.  No murmur heard. Pulmonary/Chest: Effort normal and breath sounds normal. No respiratory distress.  Abdominal: Soft. Bowel sounds are normal. He exhibits no distension. There is no tenderness. There is no guarding.  Musculoskeletal: Normal range of motion. He exhibits no edema.  Neurological: He is alert. No cranial nerve deficit or sensory deficit. He exhibits normal muscle tone. Coordination normal.  Pupils reactive. No facial asymmetry noted. Cranial nerves appear grossly intact. Sensation intact to light touch on face, BUE and BLE. Strength 5/5 in BUE and BLE.  Skin: Skin is warm and dry. No rash noted.  Psychiatric: He has a normal mood and affect.  Nursing note and vitals reviewed.    ED Treatments / Results  Labs (all labs ordered are listed, but only abnormal  results are displayed) Labs Reviewed - No data to display  EKG  EKG Interpretation None       Radiology No results found.  Procedures Procedures (including critical care time)  Medications Ordered in ED Medications - No data to display   Initial Impression / Assessment and Plan / ED Course  I have reviewed the triage vital signs and the nursing notes.  Pertinent labs & imaging results that were available during my care of the patient were reviewed by me and considered in my medical decision making (see chart for details).     Patient presents to ED for evaluation of multiple unrelated complaints including foreign body sensation in left ear, bilateral clear eye drainage as well as high blood pressure.  No foreign body visualized in bilateral ears.  There is evidence of viral conjunctivitis in bilateral eyes with no purulent drainage, changes in vision, proptosis, foreign body sensation or trauma  to the area.  Regarding his high blood pressure, he is normotensive at 126/86 here in the ED.  He denies any chest pain, shortness of breath, headache or vision changes that would signify endorgan damage caused by high blood pressure.  I explained to patient that blood pressure readings could be fluctuant and there is no medication initiation warranted at this time.  I did encourage him to keep a blood pressure log at home and to bring it with him at his next PCPs appointment.  I suspect that his blood pressure readings in the past have been high due to stress or outside triggers.  Will give patient eyedrops to help with eye drainage and advised him to follow-up with primary care provider for further evaluation.  Patient appears stable for discharge at this time.  Strict return precautions given.  Final Clinical Impressions(s) / ED Diagnoses   Final diagnoses:  Eye tearing, bilateral  Foreign body sensation in left ear canal    ED Discharge Orders    None     Portions of this note were  generated with Dragon dictation software. Dictation errors may occur despite best attempts at proofreading.    Dietrich Pates, PA-C 06/30/17 1314    Loren Racer, MD 07/01/17 [phone]

## 2017-06-30 NOTE — Discharge Instructions (Signed)
Use eyedrops as directed to help with irritation. Follow up with primary care provider for further evaluation. Be sure to keep a log of your blood pressure readings at random times of the day to bring to your PCP's appointment. Return to ED for worsening symptoms, vision changes, chest pain, shortness of breath, severe headache.

## 2017-07-03 NOTE — Progress Notes (Signed)
   [facility] Phone: [phone]   Date of Visit: 07/04/2017   HPI:  Elevated blood pressures: -Patient reports that starting Friday he noticed that his blood pressures were elevated.   she he had a severe headache with slight blurred vision Friday evening and called EMS.  Blood pressure by EMS reportedly was in the 200s for systolic.  The patient did not go to the ED at that time. -Saturday morning his blood pressure was 162/103.  At that time his eyes were tearing so he went to the ED.  At the ED his blood pressure was normal and he was given eyedrops for his eyes.  The eyedrops helped with his eye symptoms and therefore he stopped taking them. -However his blood pressures have been elevated when he checks it at home with a arm cuff.  Blood pressures have been the following: 146/80, 152/90, 132/109.  Patient reports that he -Does monitor his salt intake and does not consume added salt or any canned foods or any processed foods or fast food -Other than running around with his children he does not do any formal exercise regularly -He does use cigarettes but since Friday has been successfully cutting back slowly.  He plans to come off of it completely at some point -Family history of hypertension in his mother, maternal grandmother, maternal aunts and uncles. -No family history of MI.  Family history of early stroke (maternal grandmother in her late 6850s to early 2960s) -Denies any current chest pain, headache, palpitations  ROS: See HPI.  PMFSH:  PMH: Severe Obesity Tobacco Use DO Diverticulosis of Colon Meralgia Paresthetica   PHYSICAL EXAM: BP 132/90   Pulse 79   Temp 98.8 F (37.1 C) (Oral)   Wt (!) 313 lb (142 kg)   PF 97 L/min   BMI 46.22 kg/m  GEN: NAD, obese HEENT: Atraumatic, normocephalic, neck supple, EOMI, sclera clear  CV: RRR, no murmurs, rubs, or gallops PULM: CTAB, normal effort SKIN: No rash or cyanosis; warm and well-perfused PSYCH:  Mood and affect euthymic, normal rate and volume of speech NEURO: Awake, alert, no focal deficits grossly, normal speech   ASSESSMENT/PLAN:  Health maintenance:  -Flu vaccine done today  Essential hypertension He has had multiple elevated BP values at home and with EMS, therefore will start low dose med, Norvasc 5mg  daily. Discussed monitoring for low BP. BMP to check Cr and electrolytes.   Severe obesity (BMI >= 40) (HCC) Has history of elevated glucose on BMPs. No symptoms of DM. Will screen with hemoglobin A1c. Will also need fasting lipid panel at the next visit. Discussed the importance of regular exercise.   FOLLOW UP: Follow up in 3 weeks for HTN   Palma HolterKanishka G Raylin Winer, MD PGY 3 [facility]

## 2017-07-04 ENCOUNTER — Ambulatory Visit (INDEPENDENT_AMBULATORY_CARE_PROVIDER_SITE_OTHER): Payer: Medicaid Other | Admitting: Internal Medicine

## 2017-07-04 ENCOUNTER — Encounter: Payer: Self-pay | Admitting: Internal Medicine

## 2017-07-04 ENCOUNTER — Other Ambulatory Visit: Payer: Self-pay

## 2017-07-04 VITALS — BP 132/90 | HR 79 | Temp 98.8°F | Wt 313.0 lb

## 2017-07-04 DIAGNOSIS — Z23 Encounter for immunization: Secondary | ICD-10-CM | POA: Diagnosis not present

## 2017-07-04 DIAGNOSIS — I10 Essential (primary) hypertension: Secondary | ICD-10-CM

## 2017-07-04 LAB — POCT GLYCOSYLATED HEMOGLOBIN (HGB A1C): Hemoglobin A1C: 9.1

## 2017-07-04 MED ORDER — AMLODIPINE BESYLATE 5 MG PO TABS: 5.0000 mg | ORAL_TABLET | Freq: Every day | ORAL | 0 refills | Status: DC

## 2017-07-04 NOTE — Patient Instructions (Signed)
Let's start Norvasc 5 mg daily Keep a record of your blood pressure.  The next time you come in, please bring your blood pressure monitor.  Please follow up in 3 weeks

## 2017-07-05 ENCOUNTER — Telehealth: Payer: Self-pay | Admitting: Internal Medicine

## 2017-07-05 LAB — BASIC METABOLIC PANEL
BUN / CREAT RATIO: 8 — AB (ref 9–20)
BUN: 8 mg/dL (ref 6–20)
CO2: 20 mmol/L (ref 20–29)
CREATININE: 0.95 mg/dL (ref 0.76–1.27)
Calcium: 8.9 mg/dL (ref 8.7–10.2)
Chloride: 101 mmol/L (ref 96–106)
GFR calc Af Amer: 117 mL/min/{1.73_m2} (ref >59)
GFR calc non Af Amer: 101 mL/min/{1.73_m2} (ref >59)
GLUCOSE: 270 mg/dL — AB (ref 65–99)
Potassium: 4.1 mmol/L (ref 3.5–5.2)
Sodium: 137 mmol/L (ref 134–144)

## 2017-07-05 NOTE — Telephone Encounter (Signed)
Attempted to call patient to discuss new diagnosis of DM2 with elevated A1c. Went to [employer]. Left message to call back.

## 2017-07-06 ENCOUNTER — Encounter: Payer: Self-pay | Admitting: Internal Medicine

## 2017-07-06 NOTE — Assessment & Plan Note (Addendum)
Has history of elevated glucose on BMPs. No symptoms of DM. Will screen with hemoglobin A1c. Will also need fasting lipid panel at the next visit. Discussed the importance of regular exercise.

## 2017-07-06 NOTE — Assessment & Plan Note (Signed)
He has had multiple elevated BP values at home and with EMS, therefore will start low dose med, Norvasc 5mg  daily. Discussed monitoring for low BP.

## 2017-07-06 NOTE — Telephone Encounter (Signed)
Pt lm on nurse line, returning call to Dr. Ottie GlazierGunadasa. Donye Campanelli, Maryjo RochesterJessica Dawn, CMA

## 2017-07-06 NOTE — Telephone Encounter (Signed)
Attempted to call but went to voicemail. Left message to call back and asked to leave a time that he would be available to talk over the phone.

## 2017-07-07 ENCOUNTER — Emergency Department (HOSPITAL_COMMUNITY)
Admission: EM | Admit: 2017-07-07 | Discharge: 2017-07-07 | Disposition: A | Discharge status: Home / Self Care | Payer: Medicaid Other | Attending: Emergency Medicine | Admitting: Emergency Medicine

## 2017-07-07 ENCOUNTER — Emergency Department (HOSPITAL_COMMUNITY): Payer: Medicaid Other

## 2017-07-07 ENCOUNTER — Encounter (HOSPITAL_COMMUNITY): Payer: Self-pay | Admitting: Emergency Medicine

## 2017-07-07 DIAGNOSIS — Z79899 Other long term (current) drug therapy: Secondary | ICD-10-CM | POA: Diagnosis not present

## 2017-07-07 DIAGNOSIS — S62321A Displaced fracture of shaft of second metacarpal bone, left hand, initial encounter for closed fracture: Secondary | ICD-10-CM | POA: Insufficient documentation

## 2017-07-07 DIAGNOSIS — J45909 Unspecified asthma, uncomplicated: Secondary | ICD-10-CM | POA: Insufficient documentation

## 2017-07-07 DIAGNOSIS — Y999 Unspecified external cause status: Secondary | ICD-10-CM | POA: Insufficient documentation

## 2017-07-07 DIAGNOSIS — F17200 Nicotine dependence, unspecified, uncomplicated: Secondary | ICD-10-CM | POA: Insufficient documentation

## 2017-07-07 DIAGNOSIS — X838XXA Intentional self-harm by other specified means, initial encounter: Secondary | ICD-10-CM | POA: Diagnosis not present

## 2017-07-07 DIAGNOSIS — Y9389 Activity, other specified: Secondary | ICD-10-CM | POA: Diagnosis not present

## 2017-07-07 DIAGNOSIS — I10 Essential (primary) hypertension: Secondary | ICD-10-CM | POA: Diagnosis not present

## 2017-07-07 DIAGNOSIS — Y929 Unspecified place or not applicable: Secondary | ICD-10-CM | POA: Diagnosis not present

## 2017-07-07 DIAGNOSIS — S6992XA Unspecified injury of left wrist, hand and finger(s), initial encounter: Secondary | ICD-10-CM | POA: Diagnosis present

## 2017-07-07 MED ORDER — HYDROCODONE-ACETAMINOPHEN 5-325 MG PO TABS: 2.0000 | ORAL_TABLET | ORAL | 0 refills | Status: DC | PRN

## 2017-07-07 MED ORDER — HYDROCODONE-ACETAMINOPHEN 5-325 MG PO TABS
2.0000 | ORAL_TABLET | Freq: Once | ORAL | Status: AC
  Administered 2017-07-07: 2 via ORAL
  Filled 2017-07-07: qty 2

## 2017-07-07 NOTE — ED Notes (Signed)
Patient transported to x-ray. ?

## 2017-07-07 NOTE — ED Triage Notes (Signed)
Pt c/o L ring finger pain after punching a brick wall last night. Sensation intact, severe pain with movement.

## 2017-07-07 NOTE — ED Provider Notes (Signed)
[facility] Provider Note   CSN: [id number] Arrival date & time: 07/07/17  1227     History   Chief Complaint Chief Complaint  Patient presents with  . Finger Injury    HPI Andre Reynolds is a 37 y.o. male.  The history is provided by the patient. No language interpreter was used.  Hand Pain  This is a new problem. The current episode started 6 to 12 hours ago. The problem occurs constantly. The problem has been gradually worsening. Nothing aggravates the symptoms. Nothing relieves the symptoms. He has tried nothing for the symptoms. The treatment provided no relief.   Pt complains of pain to his hand from an injury last pm.   Past Medical History:  Diagnosis Date  . Asthma   . DIVERTICULITIS, HX OF 08/03/2009   Qualifier: Diagnosis of  By: Gilford Rileobbia MD, Luisa HartPatrick    . Pneumonia     Patient Active Problem List   Diagnosis Date Noted  . Essential hypertension 07/04/2017  . Severe obesity (BMI >= 40) (HCC) 02/19/2017  . Diverticulosis of colon 02/19/2017  . Tobacco use disorder 02/19/2017  . Meralgia paresthetica, left 06/07/2016    Past Surgical History:  Procedure Laterality Date  . WISDOM TOOTH EXTRACTION    . WRIST SURGERY         Home Medications    Prior to Admission medications   Medication Sig Start Date End Date Taking? Authorizing Provider  acetaminophen (TYLENOL) 500 MG tablet Take 1 tablet (500 mg total) by mouth every 6 (six) hours as needed. 09/22/16   Diallo, Lilia ArgueAbdoulaye, MD  amLODipine (NORVASC) 5 MG tablet Take 1 tablet (5 mg total) by mouth daily. 07/04/17   Palma HolterGunadasa, Kanishka G, MD  gabapentin (NEURONTIN) 300 MG capsule Take 1 capsule (300 mg total) by mouth 2 (two) times daily. Patient taking differently: Take 300 mg by mouth 3 (three) times daily.  10/18/16   Pincus LargePhelps, Jazma Y, DO  HYDROcodone-acetaminophen (NORCO/VICODIN) 5-325 MG tablet Take 2 tablets by mouth every 4 (four) hours as needed. 07/07/17   Elson AreasSofia,  Lakely Elmendorf K, PA-C  ibuprofen (ADVIL,MOTRIN) 600 MG tablet Take 1 tablet (600 mg total) by mouth every 6 (six) hours as needed for mild pain or moderate pain. 09/22/16   Lovena Neighboursiallo, Abdoulaye, MD    Family History Family History  Problem Relation Age of Onset  . Diabetes Mother   . Alcohol abuse Mother   . Asthma Mother   . Hypertension Mother   . Breast cancer Mother   . Diabetes Father   . Drug abuse Sister   . Diabetes Maternal Grandmother   . Diabetes Maternal Aunt   . Diabetes Cousin        maternal cousin    Social History Social History   Tobacco Use  . Smoking status: Current Every Day Smoker  . Smokeless tobacco: Never Used  Substance Use Topics  . Alcohol use: Yes  . Drug use: Yes    Types: Marijuana     Allergies   Patient has no known allergies.   Review of Systems Review of Systems  All other systems reviewed and are negative.    Physical Exam Updated Vital Signs BP 128/90 (BP Location: Right Arm)   Pulse (!) 111   Temp 98.7 F (37.1 C) (Oral)   Resp 18   SpO2 99%   Physical Exam  Constitutional: He appears well-developed and well-nourished.  HENT:  Head: Normocephalic.  Musculoskeletal: He exhibits tenderness and  deformity.  Swollen tender mid hand and ring finger,  nv and ns intact   Neurological: He is alert.  Skin: Skin is warm.  Psychiatric: He has a normal mood and affect.     ED Treatments / Results  Labs (all labs ordered are listed, but only abnormal results are displayed) Labs Reviewed - No data to display  EKG  EKG Interpretation None       Radiology Dg Hand Complete Left  Result Date: 07/07/2017 CLINICAL DATA:  Punched a brick wall. Left hand swelling around the knuckles. Initial encounter. EXAM: LEFT HAND - COMPLETE 3+ VIEW COMPARISON:  None. FINDINGS: Oblique fracture through the shaft of the fourth metacarpal with up to 1 mm of posterior displacement. No dislocation. IMPRESSION: Fourth metacarpal shaft fracture with  mild displacement. Electronically Signed   By: Marnee Spring M.D.   On: 07/07/2017 13:44   Dg Finger Ring Left  Result Date: 07/07/2017 CLINICAL DATA:  37 year old male with a history of trauma. Left little finger pain EXAM: LEFT RING FINGER 2+V COMPARISON:  None. FINDINGS: No acute displaced fracture. No radiopaque foreign body. No focal soft tissue swelling. No dislocation. IMPRESSION: Negative for acute bony abnormality Electronically Signed   By: Gilmer Mor D.O.   On: 07/07/2017 13:14    Procedures Procedures (including critical care time)  Medications Ordered in ED Medications  HYDROcodone-acetaminophen (NORCO/VICODIN) 5-325 MG per tablet 2 tablet (2 tablets Oral Given 07/07/17 1438)     Initial Impression / Assessment and Plan / ED Course  I have reviewed the triage vital signs and the nursing notes.  Pertinent labs & imaging results that were available during my care of the patient were reviewed by me and considered in my medical decision making (see chart for details).  Clinical Course as of Jul 08 1639  Sat Jul 07, 2017  1311 DG Finger Ring Left [LS]    Clinical Course User Index [LS] Elson Areas, [place]    Xrays  Reviewed by me, report reviewed. Pt had finger xray initially.  I had xray obtain a hand film  Xray shows a 4th metacarpal shaft fracture.  Pt placed in ulna gutter splint.  Pt advised ice, elevate.  Pt is to call Dr. Amanda Pea on Monday to schedule appointment for evalution.  Xrays reviewed with Dr. Amanda Pea.     Final Clinical Impressions(s) / ED Diagnoses   Final diagnoses:  Closed displaced fracture of shaft of second metacarpal bone of left hand, initial encounter    ED Discharge Orders        Ordered    HYDROcodone-acetaminophen (NORCO/VICODIN) 5-325 MG tablet  Every 4 hours PRN     07/07/17 1457    An After Visit Summary was printed and given to the patient.    Elson Areas, [place] 07/07/17 1644    Gerhard Munch, MD 07/08/17 1012

## 2017-07-09 NOTE — Telephone Encounter (Signed)
Attempted to call. Went straight to [employer]. Left message to call back.

## 2017-07-09 NOTE — Telephone Encounter (Signed)
Called emergency contact. Went to [employer]

## 2017-07-09 NOTE — Telephone Encounter (Signed)
Called the number left on voicemail, but it went to voicemail. It seems like this is his place of work, therefore I did not leave a message ( especially because the voicemail stated a different person's name). I called his phone number and left message asking him to let me know a specific time that he will be free to talk over the phone.

## 2017-07-09 NOTE — Telephone Encounter (Signed)
Patient left message on nurse line that he is returning a call to Dr. Ottie GlazierGunadasa. No further info given. Ples SpecterAlisa Craigory Toste, RN [facility])

## 2017-07-09 NOTE — Telephone Encounter (Signed)
Patient returned call again. Number left on voicemail was [phone], number that imprinted on message was [phone]. Ples SpecterAlisa Brake, RN [facility] RN)

## 2017-07-12 ENCOUNTER — Telehealth: Payer: Self-pay | Admitting: Internal Medicine

## 2017-07-12 NOTE — Telephone Encounter (Signed)
Attempted to call but went to voicemail.

## 2017-07-12 NOTE — Telephone Encounter (Signed)
Attempted to call, but went to voicemail. Will  Try at 4:30

## 2017-07-12 NOTE — Telephone Encounter (Signed)
Got in touch with patients wife, but she is currently at work. I reported of new diagnosis of DM2 but that I also needed to talk to patient the plan in more detail. She reports that she will be home around 10 AM and then also after 4:30 PM. Patient's phone goes to voicemail.

## 2017-07-13 ENCOUNTER — Telehealth: Payer: Self-pay

## 2017-07-13 ENCOUNTER — Telehealth: Payer: Self-pay | Admitting: Internal Medicine

## 2017-07-13 DIAGNOSIS — E119 Type 2 diabetes mellitus without complications: Secondary | ICD-10-CM

## 2017-07-13 MED ORDER — ACCU-CHEK MULTICLIX LANCET DEV KIT: PACK | 0 refills | Status: DC

## 2017-07-13 MED ORDER — ACCU-CHEK COMPACT PLUS CARE KIT: PACK | 0 refills | Status: DC

## 2017-07-13 MED ORDER — ACCU-CHEK AVIVA PLUS W/DEVICE KIT: PACK | 0 refills | Status: DC

## 2017-07-13 MED ORDER — GLUCOSE BLOOD VI STRP: ORAL_STRIP | 12 refills | Status: DC

## 2017-07-13 MED ORDER — ACCU-CHEK MULTICLIX LANCETS MISC: 12 refills | Status: DC

## 2017-07-13 MED ORDER — LANCING DEVICE MISC: 0 refills | Status: DC

## 2017-07-13 MED ORDER — FREESTYLE LANCETS MISC: 12 refills | Status: DC

## 2017-07-13 MED ORDER — METFORMIN HCL 500 MG PO TABS: ORAL_TABLET | ORAL | 0 refills | Status: DC

## 2017-07-13 NOTE — Telephone Encounter (Signed)
Re ordered

## 2017-07-13 NOTE — Telephone Encounter (Signed)
Patient wife left message on nurse line that glucometer that was ordered was not covered by their insurance. Ples SpecterAlisa Brake, RN [facility] RN)

## 2017-07-13 NOTE — Telephone Encounter (Signed)
Was able to get in touch with patient. Discussed new diagnosis of DM2. Discussed lifestyle changes; patient is motivated. Start Metformin 500mg  BID then increase to 1000mg  BID. Follow up in 2 weeks.

## 2017-07-16 ENCOUNTER — Telehealth: Payer: Self-pay | Admitting: Internal Medicine

## 2017-07-16 ENCOUNTER — Other Ambulatory Visit: Payer: Self-pay | Admitting: *Deleted

## 2017-07-16 MED ORDER — LANCING DEVICE MISC: 0 refills | Status: DC

## 2017-07-16 NOTE — Telephone Encounter (Signed)
Patient's wife came by office stated husband has only enough needles to check blood glucose today. Can these be called in as soon as possible. [facility] [address]. Any questions patient may be reached at [phone].

## 2017-07-16 NOTE — Telephone Encounter (Signed)
Fax from pharmacy states that patient's insurance no longer covers the accu-chek compact plus and will need a new kit sent to them.  Jazmin Hartsell,CMA

## 2017-07-17 ENCOUNTER — Other Ambulatory Visit: Payer: Self-pay

## 2017-07-19 MED ORDER — FREESTYLE LANCETS MISC: 12 refills | Status: DC

## 2017-07-19 MED ORDER — ACCU-CHEK COMPACT PLUS CARE KIT: PACK | 0 refills | Status: AC

## 2017-07-19 NOTE — Telephone Encounter (Signed)
Sent in prescription per request

## 2017-07-25 NOTE — Progress Notes (Signed)
[facility] Phone: [phone]   Date of Visit: 07/26/2017   HPI:  DM2:  - Metformin 500mg  BID. He has not increased to 1000mg  BID yet  - he did have some diarrhea which is intermittent  - Diet: no fried foods any more, no dark sodas, more water intake, cut down on juice.  - Walking 1.5 - 2 miles; tries to do at least once a day  - Eats three meals a day; does not really snack  - CBGs:    Leg Pain:  - reports of chronic history of left leg pain since 1999  - reports that he initially had this pain after playing basketball in 1999. His provider at the time stated that he will always have this pain because he injured his nerve in his leg.  - his symptoms improved for a while. Reports that his symptoms returned as he got older - per chart review, this was thought to be due to meralgia paresthetica. He was on Gabapentin  300mg  TID but he stopped this medication as it was not helping at all.  - he reports of numbness intermittently starting on his left lateral thigh. He also reports of pain as well in that region. He has pain with walking and at rest.   ROS: See HPI.  PMFSH:  PMH: HTN DM2 Tobacco Use Severe Obesity  Colon Diverticulosis  PHYSICAL EXAM: BP 130/90   Pulse (!) 101   Temp 99 F (37.2 C) (Oral)   Wt (!) 308 lb (139.7 kg)   SpO2 94%   BMI 45.48 kg/m  GEN: NAD CV: RRR, no murmurs, rubs, or gallops PULM: CTAB, normal effort SKIN: No rash or cyanosis; warm and well-perfused EXTR: No lower extremity edema or calf tenderness PSYCH: Mood and affect euthymic, normal rate and volume of speech NEURO: Awake, alert, no focal deficits grossly, normal speech MSK:  Spine: no tenderness of palpation at mid-line or paraspinal muscles. Normal range of motion of spine without pain  LLE: reports of tenderness of palpation of the anterior lateral thigh but thigh is soft. 5/5 strength of the lower extremity RLE: 5/5 strength  Reports of different  sensation to light touch and monofilament on the left lower extremity compared to the right  Unable to obtain patellar reflexes bilaterally but normal achilles reflexes bilaterally  Normal DP pulses bilaterally    Diabetic Foot Exam - Simple   Simple Foot Form Diabetic Foot exam was performed with the following findings:  Yes 07/26/2017  1:38 PM  Visual Inspection No deformities, no ulcerations, no other skin breakdown bilaterally:  Yes Sensation Testing Intact to touch and monofilament testing bilaterally:  Yes Pulse Check Posterior Tibialis and Dorsalis pulse intact bilaterally:  Yes Comments     ASSESSMENT/PLAN:   Type 2 diabetes mellitus (HCC) Newly diagnosed DM2. His cbgs have improved from 200s to 100s with only Metformin 500mg  BID. Due to intermittent diarrhea with Metformin, will prescribe Metformim ER 1000mg  daily then increase to 2000mg  daily. He has improved his diet and exercise significantly since our last visit, and he has also lost 5 pounds since the end of January. Because of all the improvement will not add a second agent just yet. PNA 23 V vaccine today. Urine microalbumin today. Lipid panel today. Foot exam completed today.  Follow up in 3 weeks.   Meralgia paresthetica, left His symptoms are not consistent with radiculopathy. Not consistent with compartment syndrome. Unlikely vascular related. No improvement with Neurontin. Will do  trial of Lyrica 50mg  BID. Will also order TSH and Vitamin B12 level for further evaluation. Follow up 3-4 weeks.   Microscopic hematuria Persistent microscopic hematuria with history of tobacco use. Will refer to urology for further evaluation.    Palma HolterKanishka G Gunadasa, MD PGY 3 [facility]

## 2017-07-26 ENCOUNTER — Ambulatory Visit: Payer: Medicaid Other | Admitting: Internal Medicine

## 2017-07-26 ENCOUNTER — Encounter: Payer: Self-pay | Admitting: Internal Medicine

## 2017-07-26 ENCOUNTER — Other Ambulatory Visit: Payer: Self-pay

## 2017-07-26 ENCOUNTER — Telehealth: Payer: Self-pay | Admitting: Internal Medicine

## 2017-07-26 VITALS — BP 130/90 | HR 101 | Temp 99.0°F | Wt 308.0 lb

## 2017-07-26 DIAGNOSIS — Z23 Encounter for immunization: Secondary | ICD-10-CM

## 2017-07-26 DIAGNOSIS — E118 Type 2 diabetes mellitus with unspecified complications: Secondary | ICD-10-CM

## 2017-07-26 DIAGNOSIS — R3129 Other microscopic hematuria: Secondary | ICD-10-CM | POA: Diagnosis not present

## 2017-07-26 DIAGNOSIS — G5712 Meralgia paresthetica, left lower limb: Secondary | ICD-10-CM

## 2017-07-26 DIAGNOSIS — R200 Anesthesia of skin: Secondary | ICD-10-CM

## 2017-07-26 LAB — POCT URINALYSIS DIP (MANUAL ENTRY)
GLUCOSE UA: NEGATIVE mg/dL
LEUKOCYTES UA: NEGATIVE
NITRITE UA: NEGATIVE
Protein Ur, POC: 100 mg/dL — AB
Spec Grav, UA: 1.025 (ref 1.010–1.025)
UROBILINOGEN UA: 1 U/dL
pH, UA: 6 (ref 5.0–8.0)

## 2017-07-26 MED ORDER — PREGABALIN 50 MG PO CAPS: 50.0000 mg | ORAL_CAPSULE | Freq: Two times a day (BID) | ORAL | 0 refills | Status: DC

## 2017-07-26 MED ORDER — METFORMIN HCL ER (MOD) 1000 MG PO TB24: ORAL_TABLET | ORAL | 0 refills | Status: DC

## 2017-07-26 NOTE — Patient Instructions (Signed)
Lets try extended release Metformin  Continue to exercise and improve your diet  If your sugars do not go down to the ones consistently with the metformin, let me know  Let's try Lyrica for your pain  Follow up in 3-4 weeks

## 2017-07-26 NOTE — Telephone Encounter (Signed)
Due to persistent hematuria and history of smoking, will refer to urology.

## 2017-07-26 NOTE — Assessment & Plan Note (Addendum)
His symptoms are not consistent with radiculopathy. Not consistent with compartment syndrome. Unlikely vascular related. No improvement with Neurontin. Will do trial of Lyrica 50mg  BID. Will also order TSH and Vitamin B12 level for further evaluation. Follow up 3-4 weeks.

## 2017-07-26 NOTE — Assessment & Plan Note (Addendum)
Newly diagnosed DM2. His cbgs have improved from 200s to 100s with only Metformin 500mg  BID. Due to intermittent diarrhea with Metformin, will prescribe Metformim ER 1000mg  daily then increase to 2000mg  daily. He has improved his diet and exercise significantly since our last visit, and he has also lost 5 pounds since the end of January. Because of all the improvement will not add a second agent just yet. PNA 23 V vaccine today. Urine microalbumin today. Lipid panel today. Foot exam completed today.  Follow up in 3 weeks.

## 2017-07-26 NOTE — Assessment & Plan Note (Signed)
Persistent microscopic hematuria with history of tobacco use. Will refer to urology for further evaluation.

## 2017-07-27 ENCOUNTER — Telehealth: Payer: Self-pay

## 2017-07-27 LAB — TSH: TSH: 1.07 u[IU]/mL (ref 0.450–4.500)

## 2017-07-27 LAB — LIPID PANEL
CHOLESTEROL TOTAL: 160 mg/dL (ref 100–199)
Chol/HDL Ratio: 4.1 ratio (ref 0.0–5.0)
HDL: 39 mg/dL — ABNORMAL LOW (ref >39)
LDL Calculated: 87 mg/dL (ref 0–99)
TRIGLYCERIDES: 171 mg/dL — AB (ref 0–149)
VLDL Cholesterol Cal: 34 mg/dL (ref 5–40)

## 2017-07-27 LAB — MICROALBUMIN / CREATININE URINE RATIO
CREATININE, UR: 605.3 mg/dL
MICROALB/CREAT RATIO: 17.2 mg/g{creat} (ref 0.0–30.0)
MICROALBUM., U, RANDOM: 103.9 ug/mL

## 2017-07-27 LAB — VITAMIN B12: Vitamin B-12: 171 pg/mL — ABNORMAL LOW (ref 232–1245)

## 2017-07-27 NOTE — Telephone Encounter (Signed)
[facility] of Glumetza (Metformin). Form placed in MD's box for completion along with Medicaid formulary.  Ples SpecterAlisa Brake, RN [facility] RN)

## 2017-07-30 NOTE — Telephone Encounter (Signed)
Form completed and placed at RN office.

## 2017-07-31 ENCOUNTER — Telehealth: Payer: Self-pay | Admitting: Internal Medicine

## 2017-07-31 ENCOUNTER — Other Ambulatory Visit: Payer: Self-pay | Admitting: Internal Medicine

## 2017-07-31 MED ORDER — METFORMIN HCL ER 500 MG PO TB24: 1000.0000 mg | ORAL_TABLET | Freq: Every day | ORAL | 0 refills | Status: DC

## 2017-07-31 NOTE — Telephone Encounter (Signed)
Patient returned call. Call back number is [phone]. Ples SpecterAlisa Brake, RN [facility] RN)

## 2017-07-31 NOTE — Telephone Encounter (Signed)
Attempted to call patient to discuss lab results but went to voicemail.  Left message to call back  1) his lipid panel shows mildly elevated triglycerides and mildly low HDL (good cholesterol).  His LDL (bad cholesterol) is within normal limits.  However because of his diagnosis of diabetes starting a cholesterol medication called a statin is still indicated for him.  I would like him to start on Lipitor 10 mg daily.  He needs to monitor for symptoms of right upper quadrant pain or myalgias while on this medication.  2) for his leg pain and paresthesias we completed a thyroid test and a vitamin B12 test.  His thyroid test was normal.  His vitamin B12 level is low.  I am not sure if this is what is causing his leg symptoms but I think we should treat him with vitamin B12 supplements.  I would like to start him on thousand micrograms tablet daily.  We can recheck his levels in about 6 weeks.  I have pended these orders because I would like to inform him before sending them to the pharmacy.

## 2017-07-31 NOTE — Telephone Encounter (Signed)
[facility], patient has not failed preferred long acting Metformin. Consulted with PCP and new prescription sent. Ples SpecterAlisa Brake, RN [facility] RN)

## 2017-08-01 MED ORDER — CYANOCOBALAMIN 1000 MCG PO TABS
1000.0000 ug | ORAL_TABLET | Freq: Every day | ORAL | 2 refills | Status: DC
Start: 2017-08-01 — End: 2017-12-28

## 2017-08-01 MED ORDER — AMLODIPINE BESYLATE 5 MG PO TABS: 5.0000 mg | ORAL_TABLET | Freq: Every day | ORAL | 2 refills | Status: DC

## 2017-08-01 MED ORDER — ATORVASTATIN CALCIUM 10 MG PO TABS: 10.0000 mg | ORAL_TABLET | Freq: Every day | ORAL | 3 refills | Status: DC

## 2017-08-01 NOTE — Telephone Encounter (Signed)
Got in touch with patient and informed of plan. Follow up 6 weeks.

## 2017-09-03 DIAGNOSIS — R311 Benign essential microscopic hematuria: Secondary | ICD-10-CM | POA: Diagnosis not present

## 2017-09-04 ENCOUNTER — Telehealth: Payer: Self-pay

## 2017-09-04 DIAGNOSIS — G5712 Meralgia paresthetica, left lower limb: Secondary | ICD-10-CM

## 2017-09-04 DIAGNOSIS — M79605 Pain in left leg: Secondary | ICD-10-CM

## 2017-09-04 NOTE — Telephone Encounter (Signed)
Called back. Next step would be neurology referral. Reports patient never got Lyrica because it needed prior authorization. We never received this for Lyrica. Asked patient's wife to call pharmacy and have them resend PA.

## 2017-09-04 NOTE — Telephone Encounter (Signed)
Pt's wife calling for patient. States his leg pain is no better and the B12 is not helping at all. Would like to know what they can do next. Call back number [phone] Shawna OrleansMeredith B Thomsen, RN

## 2017-09-05 ENCOUNTER — Telehealth: Payer: Self-pay

## 2017-09-05 NOTE — Telephone Encounter (Signed)
[facility] prior [facility] of Lyrica . Form placed in MD's box for completion along with Medicaid formulary. Keilana Morlock, RN ([facility] RN)    

## 2017-09-06 NOTE — Telephone Encounter (Signed)
Form completed and placed in box.  

## 2017-09-06 NOTE — Telephone Encounter (Signed)
Prior approval for Lyrica completed via [place] [employer].  Med approved for 09/06/17 - 09/06/18.  Prior approval  [id number]. [facility]. Ples SpecterAlisa Brake, RN [facility] RN)

## 2017-09-09 NOTE — Progress Notes (Deleted)
   [facility] Phone: [phone]   Date of Visit: 09/10/2017   HPI:  DM2:  PMH: HTN DM2 Diverticulosis of Colon   ROS: See HPI.  PMFSH: ***  PHYSICAL EXAM: There were no vitals taken for this visit. Gen: *** HEENT: *** Heart: *** Lungs: *** Neuro: *** Ext: ***  ASSESSMENT/PLAN:  Health maintenance:  -***  No problem-specific Assessment & Plan notes found for this encounter.  FOLLOW UP: Follow up in *** for ***  Palma HolterKanishka G Gunadasa, MD PGY 2 [facility]

## 2017-09-10 ENCOUNTER — Ambulatory Visit: Payer: Medicaid Other | Admitting: Internal Medicine

## 2017-09-11 ENCOUNTER — Encounter: Payer: Self-pay | Admitting: Neurology

## 2017-09-13 NOTE — Progress Notes (Signed)
   [facility] Phone: [phone]   Date of Visit: 09/14/2017   HPI:  DM2:  - has not checked cbg "in weeks" as he has not gotten battery for his meter - the last time he has checked cbg, they were in the low 100s.  - is compliant with Metformin ER 2000mg  daily  - Diet changes: cut fried foods, more baked foods, more vegetables and fruits. Does not necessarily pay attention to starchy foods in his meal.  - Exercise: walk around the house and to the mailbox. His left leg pain is limiting his exercise currently but he is seeing neurology for this soon.  - tolerating and compliant with statin  - has not seen an eye doctor yet - is cutting down on smoking. Currently cut down to 2-3 cigarettes a day. His daughter is his motivating factor.   ROS: See HPI.  PMFSH:  PMH: HTN DM2 Colonic Diverticulosis Microscopic Hematuria Tobacco Use DO Obesity   PHYSICAL EXAM: BP 126/72   Pulse 99   Temp 98.3 F (36.8 C) (Oral)   Ht 5\' 8"  (1.727 m)   Wt (!) 303 lb (137.4 kg)   SpO2 96%   BMI 46.07 kg/m  GEN: NAD HEENT: Atraumatic, normocephalic, neck supple, EOMI, sclera clear  CV: RRR, no murmurs, rubs, or gallops PULM: CTAB, normal effort ABD: Soft, nontender, nondistended, NABS, no organomegaly SKIN: No rash or cyanosis; warm and well-perfused EXTR: No lower extremity edema or calf tenderness PSYCH: Mood and affect euthymic, normal rate and volume of speech NEURO: Awake, alert, no focal deficits grossly, normal speech   ASSESSMENT/PLAN:  Essential hypertension Continue Norvasc 5mg  daily. BP controlled  Type 2 diabetes mellitus (HCC) Has not checked his cbg in a few weeks but at his last check his cbgs seem controlled in the low 100s with Metformin ER 2000 mg daily. Discussed how he can improve his diet. Discussed other ways to improve his exercise: getting gym membership so he can use the pool (which he is interested) and doing upper body work outs when his  leg is hurting him. Not quite due for repeat A1c. We decided that he can make a lab visit in 3 weeks to get this checked. Follow will be dependant on this. Additionally, asked him to get in touch with me sooner if his cbgs are elevated consistently.  REferral to optometry for eye exam.   Palma HolterKanishka G Oletha Tolson, MD PGY 3 [facility]

## 2017-09-14 ENCOUNTER — Ambulatory Visit: Payer: Medicaid Other | Admitting: Internal Medicine

## 2017-09-14 ENCOUNTER — Other Ambulatory Visit: Payer: Self-pay

## 2017-09-14 ENCOUNTER — Encounter: Payer: Self-pay | Admitting: Internal Medicine

## 2017-09-14 VITALS — BP 126/72 | HR 99 | Temp 98.3°F | Ht 68.0 in | Wt 303.0 lb

## 2017-09-14 DIAGNOSIS — E118 Type 2 diabetes mellitus with unspecified complications: Secondary | ICD-10-CM

## 2017-09-14 DIAGNOSIS — I10 Essential (primary) hypertension: Secondary | ICD-10-CM

## 2017-09-14 MED ORDER — METFORMIN HCL ER 500 MG PO TB24: 1000.0000 mg | ORAL_TABLET | Freq: Every day | ORAL | 1 refills | Status: DC

## 2017-09-14 NOTE — Patient Instructions (Addendum)
  Diet Recommendations for Diabetes   Starchy (carb) foods include: Bread, rice, pasta, potatoes, corn, crackers, bagels, muffins, all baked goods.  (Fruits, milk, and yogurt also have carbohydrate, but most of these foods will not spike your blood sugar as the starchy foods will.)  A few fruits do cause high blood sugars; use small portions of bananas (limit to 1/2 at a time), grapes, and most tropical fruits.    Protein foods include: Meat, fish, poultry, eggs, dairy foods, and beans such as pinto and kidney beans (beans also provide carbohydrate).   1. Eat at least 3 meals and 1-2 snacks per day. Never go more than 4-5 hours while awake without eating.  2. Limit starchy foods to TWO per meal and ONE per snack. ONE portion of a starchy  food is equal to the following:   - ONE slice of bread (or its equivalent, such as half of a hamburger bun).   - 1/2 cup of a "scoopable" starchy food such as potatoes or rice.   - 15 grams of carbohydrate as shown on food label.  3. Both lunch and dinner should include a protein food, a carb food, and vegetables.   - Obtain twice as many veg's as protein or carbohydrate foods for both lunch and dinner.   - Fresh or frozen veg's are best.   - Try to keep frozen veg's on hand for a quick vegetable serving.    4. Breakfast should always include protein.     Please make a lab visit in 3 weeks to get your hemoglobin A1c checked.

## 2017-09-14 NOTE — Assessment & Plan Note (Addendum)
Has not checked his cbg in a few weeks but at his last check his cbgs seem controlled in the low 100s with Metformin ER 2000 mg daily. Discussed how he can improve his diet. Discussed other ways to improve his exercise: getting gym membership so he can use the pool (which he is interested) and doing upper body work outs when his leg is hurting him. Not quite due for repeat A1c. We decided that he can make a lab visit in 3 weeks to get this checked. Follow will be dependant on this. Additionally, asked him to get in touch with me sooner if his cbgs are elevated consistently.

## 2017-09-14 NOTE — Assessment & Plan Note (Signed)
Continue Norvasc 5mg  daily. BP controlled

## 2017-10-04 ENCOUNTER — Other Ambulatory Visit (INDEPENDENT_AMBULATORY_CARE_PROVIDER_SITE_OTHER): Payer: Medicaid Other

## 2017-10-04 ENCOUNTER — Telehealth: Payer: Self-pay | Admitting: Internal Medicine

## 2017-10-04 ENCOUNTER — Encounter: Payer: Self-pay | Admitting: Internal Medicine

## 2017-10-04 DIAGNOSIS — E118 Type 2 diabetes mellitus with unspecified complications: Secondary | ICD-10-CM

## 2017-10-04 LAB — POCT GLYCOSYLATED HEMOGLOBIN (HGB A1C): HEMOGLOBIN A1C: 7.5

## 2017-10-04 NOTE — Telephone Encounter (Signed)
Called patient to discuss lab results. Went to [employer]. Left message. He has mychart so will send message.

## 2017-10-05 ENCOUNTER — Other Ambulatory Visit: Payer: Medicaid Other

## 2017-10-09 NOTE — Telephone Encounter (Signed)
Pt's wife called nurse line, stating she is returning call to Dr. Ottie Glazier. Left call back number [phone] Shawna Orleans, RN

## 2017-10-10 NOTE — Telephone Encounter (Signed)
Returned call and discussed results

## 2017-11-19 ENCOUNTER — Other Ambulatory Visit: Payer: Self-pay | Admitting: Internal Medicine

## 2017-12-02 ENCOUNTER — Other Ambulatory Visit: Payer: Self-pay | Admitting: Internal Medicine

## 2017-12-28 ENCOUNTER — Other Ambulatory Visit: Payer: Self-pay | Admitting: Internal Medicine

## 2017-12-29 ENCOUNTER — Other Ambulatory Visit: Payer: Self-pay | Admitting: Internal Medicine

## 2018-01-14 ENCOUNTER — Encounter: Payer: Self-pay | Admitting: Neurology

## 2018-01-14 ENCOUNTER — Ambulatory Visit: Payer: Medicaid Other | Admitting: Neurology

## 2018-01-14 VITALS — BP 150/100 | HR 80 | Ht 68.0 in | Wt 303.4 lb

## 2018-01-14 DIAGNOSIS — R202 Paresthesia of skin: Secondary | ICD-10-CM | POA: Diagnosis not present

## 2018-01-14 DIAGNOSIS — M79605 Pain in left leg: Secondary | ICD-10-CM

## 2018-01-14 MED ORDER — PREGABALIN 75 MG PO CAPS: 75.0000 mg | ORAL_CAPSULE | Freq: Two times a day (BID) | ORAL | 3 refills | Status: DC

## 2018-01-14 NOTE — Patient Instructions (Signed)
NCS/EMG of the left > right leg  ELECTROMYOGRAM AND NERVE CONDUCTION STUDIES (EMG/NCS) INSTRUCTIONS  How to Prepare The neurologist conducting the EMG will need to know if you have certain medical conditions. Tell the neurologist and other EMG lab personnel if you: . Have a pacemaker or any other electrical medical device . Take blood-thinning medications . Have hemophilia, a blood-clotting disorder that causes prolonged bleeding Bathing Take a shower or bath shortly before your exam in order to remove oils from your skin. Don't apply lotions or creams before the exam.  What to Expect You'll likely be asked to change into a hospital gown for the procedure and lie down on an examination table. The following explanations can help you understand what will happen during the exam.  . Electrodes. The neurologist or a technician places surface electrodes at various locations on your skin depending on where you're experiencing symptoms. Or the neurologist may insert needle electrodes at different sites depending on your symptoms.  . Sensations. The electrodes will at times transmit a tiny electrical current that you may feel as a twinge or spasm. The needle electrode may cause discomfort or pain that usually ends shortly after the needle is removed. If you are concerned about discomfort or pain, you may want to talk to the neurologist about taking a short break during the exam.  . Instructions. During the needle EMG, the neurologist will assess whether there is any spontaneous electrical activity when the muscle is at rest - activity that isn't present in healthy muscle tissue - and the degree of activity when you slightly contract the muscle.  He or she will give you instructions on resting and contracting a muscle at appropriate times. Depending on what muscles and nerves the neurologist is examining, he or she may ask you to change positions during the exam.  After your EMG You may experience some  temporary, minor bruising where the needle electrode was inserted into your muscle. This bruising should fade within several days. If it persists, contact your primary care doctor.    

## 2018-01-14 NOTE — Progress Notes (Signed)
[facility] Clinic Note - Initial Visit   Date: 01/14/18  Andre Reynolds MRN: [id number] DOB: 1981-01-27   Dear Dr. Dallas Schimke:   Thank you for your kind referral of Andre Reynolds for consultation of left thigh pain. Although his history is well known to you, please allow [place] to reiterate it for the purpose of our medical record. The patient was accompanied to the clinic by wife and child who also provides collateral information.     History of Present Illness: Andre Reynolds is a 37 y.o. right-handed African American male with asthma, diabetes mellitus, hypertension, and hyperlipidemia presenting for evaluation of left leg pain.    His left leg pain has been chronic and dates back to 1999 when he was playing basketball and reports twisting his leg the wrong way.  He developed left achy thigh pain and numbness.  Since this time, pain has always been present and gradually become more intense especially over the past two years.  He has burning and throbbing sensation from the thigh all the way into his lower leg and foot.  He had tingling/numbness over the sole of the left foot.  He has tried Lyrica 39m twice daily which does not provide relief.  No benefit with gabapentin.  He does not have weakness, falls, or imbalance.  He walks unassisted.  He endorses low back pain.  He has not done any physical therapy.  He had been told he has meralgia paresthetica in the past.  He used to weigh 385lb and has lost > 80lb since being diagnosed with diabetes last year.  Out-side paper records, electronic medical record, and images have been reviewed where available and summarized as:  Lab Results  Component Value Date   TSH 1.070 07/26/2017   Lab Results  Component Value Date   VITAMINB12 171 (L) 07/26/2017   Lab Results  Component Value Date   HGBA1C 7.5 10/04/2017    Past Medical History:  Diagnosis Date  . Asthma   . DIVERTICULITIS, HX OF 08/03/2009   Qualifier: Diagnosis of  By: TVanessa KickMD, PSaralyn Pilar   . Pneumonia     Past Surgical History:  Procedure Laterality Date  . WISDOM TOOTH EXTRACTION    . WRIST SURGERY       Medications:  Outpatient Encounter Medications as of 01/14/2018  Medication Sig  . acetaminophen (TYLENOL) 500 MG tablet Take 1 tablet (500 mg total) by mouth every 6 (six) hours as needed.  .Marland KitchenamLODipine (NORVASC) 5 MG tablet TAKE 1 TABLET BY MOUTH EVERY DAY  . atorvastatin (LIPITOR) 10 MG tablet Take 1 tablet (10 mg total) by mouth daily.  . Blood Glucose Monitoring Suppl (ACCU-CHEK COMPACT CARE KIT) KIT Use to check sugar three times a day  . CVS VITAMIN B12 1000 MCG tablet TAKE 1 TABLET BY MOUTH EVERY DAY  . glucose blood (ACCU-CHEK COMPACT PLUS) test strip Use to check sugar three times a day  . ibuprofen (ADVIL,MOTRIN) 600 MG tablet Take 1 tablet (600 mg total) by mouth every 6 (six) hours as needed for mild pain or moderate pain.  .Elmore GuiseDevices (LANCING DEVICE) MISC Use to check sugar three times a day  . Lancets (FREESTYLE) lancets Use to check sugar three times a day  . LYRICA 50 MG capsule TAKE 1 CAPSULE (50 MG TOTAL) BY MOUTH 2 (TWO) TIMES DAILY.  . metFORMIN (GLUCOPHAGE-XR) 500 MG 24 hr tablet TAKE 2 TABLETS BY MOUTH DAILY WITH BREAKFAST. THEN INCREASE TO  4 TABLETS AFTER 1 WEEK.  Marland Kitchen HYDROcodone-acetaminophen (NORCO/VICODIN) 5-325 MG tablet Take 2 tablets by mouth every 4 (four) hours as needed. (Patient not taking: Reported on 01/14/2018)   No facility-administered encounter medications on file as of 01/14/2018.      Allergies: No Known Allergies  Family History: Family History  Problem Relation Age of Onset  . Diabetes Mother   . Alcohol abuse Mother   . Asthma Mother   . Hypertension Mother   . Breast cancer Mother   . Diabetes Father   . Drug abuse Sister   . Diabetes Maternal Grandmother   . Diabetes Maternal Aunt   . Diabetes Cousin        maternal cousin    Social History: Social History     Tobacco Use  . Smoking status: Current Every Day Smoker  . Smokeless tobacco: Never Used  Substance Use Topics  . Alcohol use: Yes  . Drug use: Yes    Types: Marijuana   Social History   Social History Narrative   Unemployed.  Lives with wife and children in a one story home.  Has 6 children.  Education: 10th grade.       Review of Systems:  CONSTITUTIONAL: No fevers, chills, night sweats, +weight loss.   EYES: No visual changes or eye pain ENT: No hearing changes.  No history of nose bleeds.   RESPIRATORY: No cough, wheezing and shortness of breath.   CARDIOVASCULAR: Negative for chest pain, and palpitations.   GI: Negative for abdominal discomfort, blood in stools or black stools.  No recent change in bowel habits.   GU:  No history of incontinence.   MUSCLOSKELETAL: No history of joint pain or swelling.  No myalgias.   SKIN: Negative for lesions, rash, and itching.   HEMATOLOGY/ONCOLOGY: Negative for prolonged bleeding, bruising easily, and swollen nodes.  No history of cancer.   ENDOCRINE: Negative for cold or heat intolerance, polydipsia or goiter.   PSYCH:  No depression or anxiety symptoms.   NEURO: As Above.   Vital Signs:  BP (!) 150/100   Pulse 80   Ht _0  (1.727 m)   Wt (!) 303 lb 6 oz (137.6 kg)   SpO2 97%   BMI 46.13 kg/m    General Medical Exam:   General:  Well appearing, comfortable.   Eyes/ENT: see cranial nerve examination.   Neck: No masses appreciated.  Full range of motion without tenderness.  No carotid bruits. Respiratory:  Clear to auscultation, good air entry bilaterally.   Cardiac:  Regular rate and rhythm, no murmur.   Extremities:  No deformities, edema, or skin discoloration.  Skin:  No rashes or lesions.  Neurological Exam: MENTAL STATUS including orientation to time, place, person, recent and remote memory, attention span and concentration, language, and fund of knowledge is normal.  Speech is not dysarthric.  CRANIAL  NERVES: II:  No visual field defects.  Unremarkable fundi.   III-IV-VI: Pupils equal round and reactive to light.  Normal conjugate, extra-ocular eye movements in all directions of gaze.  No nystagmus.  No ptosis.   V:  Normal facial sensation.     VII:  Normal facial symmetry and movements.  No pathologic facial reflexes.  VIII:  Normal hearing and vestibular function.   IX-X:  Normal palatal movement.   XI:  Normal shoulder shrug and head rotation.   XII:  Normal tongue strength and range of motion, no deviation or fasciculation.  MOTOR:  No atrophy, fasciculations  or abnormal movements.  No pronator drift.  Tone is normal.    Right Upper Extremity:    Left Upper Extremity:    Deltoid  5/5   Deltoid  5/5   Biceps  5/5   Biceps  5/5   Triceps  5/5   Triceps  5/5   Wrist extensors  5/5   Wrist extensors  5/5   Wrist flexors  5/5   Wrist flexors  5/5   Finger extensors  5/5   Finger extensors  5/5   Finger flexors  5/5   Finger flexors  5/5   Dorsal interossei  5/5   Dorsal interossei  5/5   Abductor pollicis  5/5   Abductor pollicis  5/5   Tone (Ashworth scale)  0  Tone (Ashworth scale)  0   Right Lower Extremity:    Left Lower Extremity:    Hip flexors  5/5   Hip flexors  5/5   Hip extensors  5/5   Hip extensors  5/5   Knee flexors  5/5   Knee flexors  5/5   Knee extensors  5/5   Knee extensors  5/5   Dorsiflexors  5/5   Dorsiflexors  5/5   Plantarflexors  5/5   Plantarflexors  5/5   Toe extensors  5/5   Toe extensors  5/5   Toe flexors  5/5   Toe flexors  5/5   Tone (Ashworth scale)  0  Tone (Ashworth scale)  0   MSRs:  Right                                                                 Left brachioradialis 2+  brachioradialis 2+  biceps 2+  biceps 2+  triceps 2+  triceps 2+  patellar 1+  patellar 1+  ankle jerk 0  ankle jerk 0  Hoffman no  Hoffman no  plantar response down  plantar response down   SENSORY:  Pin prick is reduced over the lateral thigh and entire lower  leg and foot on the left.  Vibration is reduced distally at the great toe.  Temperature is also reduced over the left leg (patchy).  Romberg's sign mildly positive.   COORDINATION/GAIT: Normal finger-to- nose-finger.  Intact rapid alternating movements bilaterally. Gait narrow based and stable. Tandem and stressed gait intact.    IMPRESSION: Left leg pain involving the lateral thigh, entirely lower leg and foot.  The distribution of his pain and paresthesia does not conform to a cutaneous nerve or dermatomal distribution.  Although meralgia paresthetica certainly could explain his left lateral thigh discomfort, it does not extending into the lower leg and foot.  Symptoms are too diffuse to be lumbosacral radiculopathy.  Lack of weakness makes lumbosacral plexopathy unlikely.  Proceed with NCS/EMG of the left > right leg.  For pain, increase Lyrica to 70m twice daily.   Thank you for allowing me to participate in patient's care.  If I can answer any additional questions, I would be pleased to do so.    Sincerely,    Donika K. PPosey Pronto DO

## 2018-01-24 ENCOUNTER — Other Ambulatory Visit: Payer: Self-pay | Admitting: Internal Medicine

## 2018-01-31 ENCOUNTER — Encounter (HOSPITAL_COMMUNITY): Payer: Self-pay

## 2018-01-31 ENCOUNTER — Inpatient Hospital Stay (HOSPITAL_COMMUNITY)
Admission: EM | Admit: 2018-01-31 | Discharge: 2018-02-02 | DRG: 872 | Disposition: A | Discharge status: Home / Self Care | Payer: Medicaid Other | Attending: Family Medicine | Admitting: Family Medicine

## 2018-01-31 ENCOUNTER — Encounter: Payer: Medicaid Other | Admitting: Neurology

## 2018-01-31 ENCOUNTER — Other Ambulatory Visit: Payer: Self-pay

## 2018-01-31 ENCOUNTER — Emergency Department (HOSPITAL_COMMUNITY): Payer: Medicaid Other

## 2018-01-31 DIAGNOSIS — K5792 Diverticulitis of intestine, part unspecified, without perforation or abscess without bleeding: Secondary | ICD-10-CM

## 2018-01-31 DIAGNOSIS — F17210 Nicotine dependence, cigarettes, uncomplicated: Secondary | ICD-10-CM | POA: Diagnosis present

## 2018-01-31 DIAGNOSIS — E1142 Type 2 diabetes mellitus with diabetic polyneuropathy: Secondary | ICD-10-CM | POA: Diagnosis present

## 2018-01-31 DIAGNOSIS — Z79899 Other long term (current) drug therapy: Secondary | ICD-10-CM | POA: Diagnosis not present

## 2018-01-31 DIAGNOSIS — J45909 Unspecified asthma, uncomplicated: Secondary | ICD-10-CM | POA: Diagnosis present

## 2018-01-31 DIAGNOSIS — I10 Essential (primary) hypertension: Secondary | ICD-10-CM | POA: Diagnosis present

## 2018-01-31 DIAGNOSIS — A419 Sepsis, unspecified organism: Secondary | ICD-10-CM | POA: Diagnosis not present

## 2018-01-31 DIAGNOSIS — R109 Unspecified abdominal pain: Secondary | ICD-10-CM | POA: Diagnosis not present

## 2018-01-31 DIAGNOSIS — Z6841 Body Mass Index (BMI) 40.0 and over, adult: Secondary | ICD-10-CM

## 2018-01-31 DIAGNOSIS — E781 Pure hyperglyceridemia: Secondary | ICD-10-CM | POA: Diagnosis present

## 2018-01-31 DIAGNOSIS — K5720 Diverticulitis of large intestine with perforation and abscess without bleeding: Secondary | ICD-10-CM | POA: Diagnosis present

## 2018-01-31 DIAGNOSIS — Z7984 Long term (current) use of oral hypoglycemic drugs: Secondary | ICD-10-CM | POA: Diagnosis not present

## 2018-01-31 DIAGNOSIS — Z23 Encounter for immunization: Secondary | ICD-10-CM

## 2018-01-31 DIAGNOSIS — E785 Hyperlipidemia, unspecified: Secondary | ICD-10-CM | POA: Diagnosis present

## 2018-01-31 DIAGNOSIS — R000 Tachycardia, unspecified: Secondary | ICD-10-CM | POA: Diagnosis not present

## 2018-01-31 LAB — URINALYSIS, ROUTINE W REFLEX MICROSCOPIC
BILIRUBIN URINE: NEGATIVE
GLUCOSE, UA: NEGATIVE mg/dL
Ketones, ur: NEGATIVE mg/dL
LEUKOCYTES UA: NEGATIVE
NITRITE: NEGATIVE
PH: 6 (ref 5.0–8.0)
Protein, ur: NEGATIVE mg/dL
SPECIFIC GRAVITY, URINE: 1.02 (ref 1.005–1.030)

## 2018-01-31 LAB — COMPREHENSIVE METABOLIC PANEL
ALT: 27 U/L (ref 0–44)
ANION GAP: 11 (ref 5–15)
AST: 18 U/L (ref 15–41)
Albumin: 3.8 g/dL (ref 3.5–5.0)
Alkaline Phosphatase: 80 U/L (ref 38–126)
BILIRUBIN TOTAL: 0.8 mg/dL (ref 0.3–1.2)
BUN: 7 mg/dL (ref 6–20)
CO2: 21 mmol/L — ABNORMAL LOW (ref 22–32)
Calcium: 8.9 mg/dL (ref 8.9–10.3)
Chloride: 104 mmol/L (ref 98–111)
Creatinine, Ser: 1.08 mg/dL (ref 0.61–1.24)
GFR calc Af Amer: >60 mL/min (ref >60)
Glucose, Bld: 150 mg/dL — ABNORMAL HIGH (ref 70–99)
POTASSIUM: 3.8 mmol/L (ref 3.5–5.1)
Sodium: 136 mmol/L (ref 135–145)
Total Protein: 7.1 g/dL (ref 6.5–8.1)

## 2018-01-31 LAB — CBC
HEMATOCRIT: 45.7 % (ref 39.0–52.0)
Hemoglobin: 14.7 g/dL (ref 13.0–17.0)
MCH: 27.1 pg (ref 26.0–34.0)
MCHC: 32.2 g/dL (ref 30.0–36.0)
MCV: 84.2 fL (ref 78.0–100.0)
Platelets: 281 10*3/uL (ref 150–400)
RBC: 5.43 MIL/uL (ref 4.22–5.81)
RDW: 14.1 % (ref 11.5–15.5)
WBC: 17 10*3/uL — AB (ref 4.0–10.5)

## 2018-01-31 LAB — GLUCOSE, CAPILLARY
Glucose-Capillary: 113 mg/dL — ABNORMAL HIGH (ref 70–99)
Glucose-Capillary: 113 mg/dL — ABNORMAL HIGH (ref 70–99)

## 2018-01-31 LAB — I-STAT CG4 LACTIC ACID, ED: LACTIC ACID, VENOUS: 1.96 mmol/L — AB (ref 0.5–1.9)

## 2018-01-31 LAB — LIPASE, BLOOD: Lipase: 34 U/L (ref 11–51)

## 2018-01-31 MED ORDER — SODIUM CHLORIDE 0.9 % IV SOLN
2.0000 g | INTRAVENOUS | Status: DC
  Administered 2018-01-31: 2 g via INTRAVENOUS
  Filled 2018-01-31: qty 20

## 2018-01-31 MED ORDER — PIPERACILLIN-TAZOBACTAM 3.375 G IVPB
3.3750 g | Freq: Three times a day (TID) | INTRAVENOUS | Status: DC
  Administered 2018-01-31 – 2018-02-02 (×6): 3.375 g via INTRAVENOUS
  Filled 2018-01-31 (×5): qty 50

## 2018-01-31 MED ORDER — IOPAMIDOL (ISOVUE-300) INJECTION 61%
INTRAVENOUS | Status: AC
  Administered 2018-01-31: 100 mL via INTRAVENOUS
  Filled 2018-01-31: qty 100

## 2018-01-31 MED ORDER — ACETAMINOPHEN 325 MG PO TABS
650.0000 mg | ORAL_TABLET | Freq: Once | ORAL | Status: AC | PRN
  Administered 2018-01-31: 650 mg via ORAL
  Filled 2018-01-31: qty 2

## 2018-01-31 MED ORDER — HEPARIN SODIUM (PORCINE) 5000 UNIT/ML IJ SOLN
5000.0000 [IU] | Freq: Three times a day (TID) | INTRAMUSCULAR | Status: DC
  Administered 2018-01-31 – 2018-02-02 (×6): 5000 [IU] via SUBCUTANEOUS
  Filled 2018-01-31 (×6): qty 1

## 2018-01-31 MED ORDER — PIPERACILLIN-TAZOBACTAM 3.375 G IVPB 30 MIN: 3.3750 g | Freq: Once | INTRAVENOUS | Status: DC

## 2018-01-31 MED ORDER — SODIUM CHLORIDE 0.9 % IV SOLN
Freq: Once | INTRAVENOUS | Status: AC
  Administered 2018-01-31: 12:00:00 via INTRAVENOUS

## 2018-01-31 MED ORDER — ONDANSETRON HCL 4 MG PO TABS: 4.0000 mg | ORAL_TABLET | Freq: Four times a day (QID) | ORAL | Status: DC | PRN

## 2018-01-31 MED ORDER — METRONIDAZOLE IN NACL 5-0.79 MG/ML-% IV SOLN
500.0000 mg | Freq: Three times a day (TID) | INTRAVENOUS | Status: DC
  Administered 2018-01-31: 500 mg via INTRAVENOUS
  Filled 2018-01-31: qty 100

## 2018-01-31 MED ORDER — SODIUM CHLORIDE 0.9 % IV SOLN: Freq: Once | INTRAVENOUS | Status: DC

## 2018-01-31 MED ORDER — INSULIN ASPART 100 UNIT/ML ~~LOC~~ SOLN
0.0000 [IU] | Freq: Three times a day (TID) | SUBCUTANEOUS | Status: DC
  Administered 2018-02-01: 2 [IU] via SUBCUTANEOUS
  Administered 2018-02-01 – 2018-02-02 (×2): 3 [IU] via SUBCUTANEOUS

## 2018-01-31 MED ORDER — ACETAMINOPHEN 650 MG RE SUPP
650.0000 mg | Freq: Four times a day (QID) | RECTAL | Status: DC | PRN
  Filled 2018-01-31: qty 1

## 2018-01-31 MED ORDER — ONDANSETRON HCL 4 MG/2ML IJ SOLN: 4.0000 mg | Freq: Four times a day (QID) | INTRAMUSCULAR | Status: DC | PRN

## 2018-01-31 MED ORDER — PREGABALIN 50 MG PO CAPS
50.0000 mg | ORAL_CAPSULE | Freq: Every day | ORAL | Status: DC
  Administered 2018-02-01: 50 mg via ORAL
  Filled 2018-01-31: qty 1

## 2018-01-31 MED ORDER — SODIUM CHLORIDE 0.9 % IV BOLUS
1000.0000 mL | Freq: Once | INTRAVENOUS | Status: AC
  Administered 2018-01-31: 1000 mL via INTRAVENOUS

## 2018-01-31 MED ORDER — MORPHINE SULFATE (PF) 4 MG/ML IV SOLN
4.0000 mg | Freq: Once | INTRAVENOUS | Status: AC
  Administered 2018-01-31: 4 mg via INTRAVENOUS
  Filled 2018-01-31: qty 1

## 2018-01-31 MED ORDER — SODIUM CHLORIDE 0.9 % IV SOLN
INTRAVENOUS | Status: DC
  Administered 2018-01-31 – 2018-02-01 (×2): via INTRAVENOUS

## 2018-01-31 MED ORDER — ONDANSETRON HCL 4 MG/2ML IJ SOLN
4.0000 mg | Freq: Once | INTRAMUSCULAR | Status: AC
  Administered 2018-01-31: 4 mg via INTRAVENOUS
  Filled 2018-01-31: qty 2

## 2018-01-31 MED ORDER — POLYETHYLENE GLYCOL 3350 17 G PO PACK: 17.0000 g | PACK | Freq: Every day | ORAL | Status: DC | PRN

## 2018-01-31 MED ORDER — ACETAMINOPHEN 325 MG PO TABS: 650.0000 mg | ORAL_TABLET | Freq: Four times a day (QID) | ORAL | Status: DC | PRN

## 2018-01-31 NOTE — H&P (Addendum)
[facility] Admission History and Physical Service Pager: [phone]  Patient name: Andre Reynolds Medical record number: [id number] Date of birth: 1980/09/29 Age: 37 y.o. Gender: male  Primary Care Provider: Bonnita Hollow, MD Consultants: General surgery Code Status: Full   Chief Complaint: Abdominal Pain   Assessment and Plan: Andre Reynolds is a 37 y.o. male presenting with LLQ abdominal. PMH is significant for type 2 diabetes mellitus, h/o diverticulosis, HLD and hypertension.   Abdominal pain 2/2 diverticulitis, with micro perforation: Pt reports progressive, non-radiating, sharp left lower quadrant abdominal pain with 1 episode of emesis starting last night. Pt has had 3 previous episodes of diverticulitis, with 1 requiring hospitalization in 2011. On arrival, meeting sepsis criteria with tachycardia to 111, febrile to 101.2, and elevated WBC of 17. Upon our evaluation, pt was hemodynamically stable and in no acute distress. Physical exam notable for tenderness to L upper and lower abdomen with hypoactive BS, no peritoneal signs. CT abdomen showing descending diverticulitis with small volume extraluminal gas without abscess or pneumoperitoneum. WBC 17.0. Lactic acid 1.96. U/A clear without signs of infection. AST, ALT, bilirubin, Cr wnl. Blood cultures obtained and pending. Received 1L NS, tylenol, morphine '4mg'$ , zofran, and 1 dose of ceftriaxone and flagyl in the ED with improvement of vitals. Low concern for substantial perforation given overall well appearance on exam, however will continue to monitor closely. Surgery was consulted in the ED, and currently recommending conservative treatment at this time given improvement with fluid resuscitation and antibiotics.   -Admit to med-surg; attending Dr. Owens Shark -cardiac monitoring  -Surgery following; appreciate recs  -NPO  -S/p Ceftriaxone and flagyl in the ED; to continue with Zosyn IV q8 hrs per surgery  recs -S/p 1L NS in the ED; cont mIVF NS 100 ml/hr    -S/p Morphine '4mg'$ , cont Tylenol as needed for fever and pain  -heparin for ppx in case he were to require surgery -Zofran IV '4mg'$  q6 PRN nausea  -Consideration for outpatient colonscopy following resolution  -vitals per unit routine   HTN: Normotensive at admission with BP 118/76. Takes home on Norvasc 5 mg daily.  -Holding home amlodipine d/t bowel rest and possibility of surgery  HLD:  Lipid panel in 07/2017 showing elevated triglycerides at 171.Takes lipitor at home 10 mg daily.  -Hold home atorvastatin d/t bowel rest   T2DM:  Last A1c 7.5 in 09/2017. At home on Metformin XR 1000 mg daily.  -Obtain A1c  -sSSI  -Monitor glucose  -Hold home metformin d/t possible procedure   Diabetic peripheral neuropathy: Stable, in left leg mainly. Takes Lyrica 50 mg daily.  -Cont home lyrica    Tobacco use:  Smokes 1PPD, 20 pack year history. Offered nicotine patch, pt declines at this time.    FEN/GI: NPO, mIVF NS 100 ml/hr  Prophylaxis: Heparin q8  Disposition: Admit to med surg inpatient   History of Present Illness:  Andre Reynolds is a 37 y.o. male, with a past history significant for hypertension, previous diverticulitis, diabetes presenting with abdominal pain since yesterday. The non-radiating, stabbing left lower quadrant abdominal pain started suddenly after eating dinner last night and further progressed through the night. He was not able to sleep well due to the pain, and had not tried anything to make the pain better. He had one bout of emesis yesterday night, appeared like the spaghetti sauce he had from dinner, however has not vomited since. Felt dizzy and abnormally cold last night and this morning.  Has not had anything to eat since dinner, but has been drinking water. Denies fever at home, chills, or dysuria. Notes his last bowel movement was 2 days ago, no melena or hematochezia. He has previously had diverticulitis 3 times in the  past, one with previous hospital admission in 2011, with the other 2 episodes handled with antibiotics outpatient in 2017/2018. Per pt, he saw a GI physician in 2018 that advised avoiding beef and drinking more water than soda and did not feel he needed surgical evaluation or a colonoscopy at that time.   On arrival to the ED, he was febrile to 101.2 F, tachycardic to 111, and appeared moderately ill. CT abdomen showing descending diverticulitis with small volume extraluminal gas without abscess or pneumoperitoneum. CBC and CMP significant for glucose 150, AST/ALT wnl, Cr 1.08, WBC 17. Lactic acid 1.96. U/A clear without signs of infection. EKG sinus tachycardia. Blood cultures obtained. Received 1L NS, tylenol, zofran, morphine '4mg'$ , and 1 dose of ceftriaxone and metronidazole. Surgery consulted in the ED. Pt no longer nauseous and abdominal pain now 3/10.   Review Of Systems: Per HPI with the following additions:   Review of Systems  Constitutional: Positive for chills and fever. Negative for weight loss.  Respiratory: Negative for cough, sputum production and shortness of breath.   Cardiovascular: Negative for chest pain, palpitations and leg swelling.  Gastrointestinal: Positive for abdominal pain and vomiting. Negative for constipation, diarrhea, heartburn and nausea.  Genitourinary: Negative for dysuria.  Musculoskeletal: Negative for back pain, myalgias and neck pain.  Neurological: Positive for dizziness. Negative for tingling, focal weakness and headaches.    Patient Active Problem List   Diagnosis Date Noted  . Diverticulitis   . Sepsis ([place])   . Microscopic hematuria 07/26/2017  . Type 2 diabetes mellitus ([place]) 07/13/2017  . Essential hypertension 07/04/2017  . Severe obesity (BMI >= 40) ([place]) 02/19/2017  . Diverticulosis of colon 02/19/2017  . Tobacco use disorder 02/19/2017  . Meralgia paresthetica, left 06/07/2016    Past Medical History: Past Medical History:  Diagnosis  Date  . Asthma   . Diabetes mellitus ([place])   . DIVERTICULITIS, HX OF 08/03/2009   Qualifier: Diagnosis of  By: Vanessa Kick MD, Saralyn Pilar    . Hypertension   . Pneumonia     Past Surgical History: Past Surgical History:  Procedure Laterality Date  . WISDOM TOOTH EXTRACTION    . WRIST SURGERY      Social History: Social History   Tobacco Use  . Smoking status: Current Every Day Smoker    Packs/day: 0.50    Years: 20.00    Pack years: 10.00  . Smokeless tobacco: Never Used  Substance Use Topics  . Alcohol use: Yes    Comment: 2-3 beer socially  . Drug use: Yes    Types: Marijuana   Additional social history: Lives at home with wife and kids, current tobacco use, 1 beer daily, +THC use.   Please also refer to relevant sections of EMR.  Family History: Family History  Problem Relation Age of Onset  . Diabetes Mother   . Alcohol abuse Mother   . Asthma Mother   . Hypertension Mother   . Breast cancer Mother   . Diabetes Father   . Drug abuse Sister   . Diabetes Maternal Grandmother   . Diabetes Maternal Aunt   . Diabetes Cousin        maternal cousin    Allergies and Medications: No Known Allergies No current facility-administered  medications on file prior to encounter.    Current Outpatient Medications on File Prior to Encounter  Medication Sig Dispense Refill  . acetaminophen (TYLENOL) 500 MG tablet Take 1 tablet (500 mg total) by mouth every 6 (six) hours as needed. 30 tablet 0  . amLODipine (NORVASC) 5 MG tablet TAKE 1 TABLET BY MOUTH EVERY DAY (Patient taking differently: Take 5 mg by mouth daily. ) 30 tablet 2  . atorvastatin (LIPITOR) 10 MG tablet Take 1 tablet (10 mg total) by mouth daily. 90 tablet 3  . CVS VITAMIN B12 1000 MCG tablet TAKE 1 TABLET BY MOUTH EVERY DAY (Patient taking differently: Take 1,000 mcg by mouth daily. ) 30 tablet 2  . LYRICA 50 MG capsule TAKE 1 CAPSULE (50 MG TOTAL) BY MOUTH 2 (TWO) TIMES DAILY. (Patient taking differently: Take 50 mg  by mouth at bedtime. ) 60 capsule 0  . metFORMIN (GLUCOPHAGE-XR) 500 MG 24 hr tablet TAKE 2 TABLETS BY MOUTH DAILY WITH BREAKFAST. THEN INCREASE TO 4 TABLETS AFTER 1 WEEK. (Patient taking differently: Take 1,000 mg by mouth daily at 12 noon. ) 120 tablet 2  . Blood Glucose Monitoring Suppl (ACCU-CHEK COMPACT CARE KIT) KIT Use to check sugar three times a day 1 each 0  . glucose blood (ACCU-CHEK COMPACT PLUS) test strip Use to check sugar three times a day 100 each 12  . HYDROcodone-acetaminophen (NORCO/VICODIN) 5-325 MG tablet Take 2 tablets by mouth every 4 (four) hours as needed. (Patient not taking: Reported on 01/14/2018) 16 tablet 0  . ibuprofen (ADVIL,MOTRIN) 600 MG tablet Take 1 tablet (600 mg total) by mouth every 6 (six) hours as needed for mild pain or moderate pain. (Patient not taking: Reported on 01/31/2018) 30 tablet 0  . Lancet Devices (LANCING DEVICE) MISC Use to check sugar three times a day 1 each 0  . Lancets (FREESTYLE) lancets Use to check sugar three times a day 100 each 12    Objective: BP 118/76 (BP Location: Right Arm)   Pulse 93   Temp (!) 100.9 F (38.3 C) (Oral)   Resp 19   Ht '5\' 8"'$  (1.727 m)   Wt 135.5 kg   SpO2 97%   BMI 45.42 kg/m  Exam: General: Alert, NAD HEENT: NCAT, MMM, oropharynx nonerythematous  Cardiac: RRR no m/g/r Lungs: Clear bilaterally, no increased WOB, on room air  Abdomen: soft, non-distended, tender to L upper and lower abdomen without rebounding or guarding. Hypoactive BS in all 4 quadrants.  Msk: Moves all extremities spontaneously  Ext: Warm, dry, 2+ distal pulses, no edema   Labs and Imaging: CBC BMET  Recent Labs  Lab 01/31/18 0816  WBC 17.0*  HGB 14.7  HCT 45.7  PLT 281   Recent Labs  Lab 01/31/18 0816  NA 136  K 3.8  CL 104  CO2 21*  BUN 7  CREATININE 1.08  GLUCOSE 150*  CALCIUM 8.9     Ct Abdomen Pelvis W Contrast  Result Date: 01/31/2018 CLINICAL DATA:  Abdominal pain with history of diverticulitis. EXAM: CT  ABDOMEN AND PELVIS WITH CONTRAST TECHNIQUE: Multidetector CT imaging of the abdomen and pelvis was performed using the standard protocol following bolus administration of intravenous contrast. CONTRAST:  100 cc Isovue-300 intravenous COMPARISON:  10/02/2017 FINDINGS: Lower chest:  No contributory findings. Hepatobiliary: No focal liver abnormality.No evidence of biliary obstruction or stone. Pancreas: Unremarkable. Spleen: Unremarkable. Adrenals/Urinary Tract: Stable mild thickening of the left adrenal medial limb, suspect underlying adenoma. No hydronephrosis or stone. Unremarkable  bladder. Stomach/Bowel: Focal distal descending low-density colonic wall thickening with pericolonic inflammation and few bubbles of gas extraluminal posterior to a thickened diverticulum. No pneumoperitoneum or abscess. Diverticulosis is seen both on the ascending and left colon. Vascular/Lymphatic: No acute vascular abnormality. No mass or adenopathy. Reproductive:No pathologic findings. Other: No ascites or pneumoperitoneum. Musculoskeletal: Notable disc degeneration at L5-S1, likely accelerated by bilateral L5 pars defects. Disc height loss and bulging presumably impinges on the bilateral L5 foraminal nerves. IMPRESSION: Descending diverticulitis. Small volume extraluminal gas without abscess or pneumoperitoneum. Electronically Signed   By: Monte Fantasia M.D.   On: 01/31/2018 10:45    Patriciaann Clan, DO 01/31/2018, 5:08 PM PGY-1, [facility] pager: [phone], text pages welcome  I have seen and evaluated the above patient with Dr. Higinio Plan and agree with her documentation.  I have included my edits in blue.   Lovenia Kim MD  [place] PGY-3

## 2018-01-31 NOTE — Consult Note (Signed)
Andre Reynolds 09-03-1980  [id number].    Requesting MD: Dr. Dorris Singh Chief Complaint/Reason for Consult: diverticulitis with small amount of extraluminal air  HPI:  This is a 37 yo obese black male with a history of HTN and DM who has had 4 prior episodes of diverticulitis.  All have been treated as an outpatient except his first attack in 2012.  He started having pain last night with some nausea.  He did not know he was febrile until he came to the ED and was found to have a temp of 102.4.  He denies diarrhea.  He denies blood in his stool at all.  He took nothing for his pain so nothing made it better.  Movements make his pain worse.  He presented to the ED today secondary to left sided abdominal pain.  He has been found to have diverticulitis of the descending colon with a small amount of extraluminal air but no abscess or phlegmon.  He was initially tachycardic, but after some fluid is down in the 90s.  His WBC is 17K.  We have been asked to see for further recommendations.  ROS: ROS : Please see HPI, otherwise all other systems have been reviewed and are negative.  Family History  Problem Relation Age of Onset  . Diabetes Mother   . Alcohol abuse Mother   . Asthma Mother   . Hypertension Mother   . Breast cancer Mother   . Diabetes Father   . Drug abuse Sister   . Diabetes Maternal Grandmother   . Diabetes Maternal Aunt   . Diabetes Cousin        maternal cousin    Past Medical History:  Diagnosis Date  . Asthma   . Diabetes mellitus ([place])   . DIVERTICULITIS, HX OF 08/03/2009   Qualifier: Diagnosis of  By: Vanessa Kick MD, Saralyn Pilar    . Hypertension   . Pneumonia     Past Surgical History:  Procedure Laterality Date  . WISDOM TOOTH EXTRACTION    . WRIST SURGERY      Social History:  reports that he has been smoking. He has a 10.00 pack-year smoking history. He has never used smokeless tobacco. He reports that he drinks alcohol. He reports that he has current or  past drug history. Drug: Marijuana.  Allergies: No Known Allergies   (Not in a hospital admission)   Physical Exam: Blood pressure 111/64, pulse (!) 106, temperature 99.8 F (37.7 C), temperature source Oral, resp. rate 19, SpO2 97 %. General: pleasant, obese black male who is laying in bed in NAD HEENT: head is normocephalic, atraumatic.  Sclera are noninjected.  PERRL.  Ears and nose without any masses or lesions.  Mouth is pink and moist Heart: regular, rate, and rhythm.  Normal s1,s2. No obvious murmurs, gallops, or rubs noted.  Palpable radial and pedal pulses bilaterally Lungs: CTAB, no wheezes, rhonchi, or rales noted.  Respiratory effort nonlabored Abd: soft, tender along the left side of the abdomen with some voluntary guarding but no peritonitis, ND, +BS, no masses, hernias, or organomegaly MS: all 4 extremities are symmetrical with no cyanosis, clubbing, or edema. Skin: warm and dry with no masses, lesions, or rashes Psych: A&Ox3 with an appropriate affect.   Results for orders placed or performed during the hospital encounter of 01/31/18 (from the past 48 hour(s))  Lipase, blood     Status: None   Collection Time: 01/31/18  8:16 AM  Result Value Ref Range  Lipase 34 11 - 51 U/L    Comment: Performed at [facility], [place] [address] [place], [place] [address]  Comprehensive metabolic panel     Status: Abnormal   Collection Time: 01/31/18  8:16 AM  Result Value Ref Range   Sodium 136 135 - 145 mmol/L   Potassium 3.8 3.5 - 5.1 mmol/L   Chloride 104 98 - 111 mmol/L   CO2 21 (L) 22 - 32 mmol/L   Glucose, Bld 150 (H) 70 - 99 mg/dL   BUN 7 6 - 20 mg/dL   Creatinine, Ser 1.08 0.61 - 1.24 mg/dL   Calcium 8.9 8.9 - 10.3 mg/dL   Total Protein 7.1 6.5 - 8.1 g/dL   Albumin 3.8 3.5 - 5.0 g/dL   AST 18 15 - 41 U/L   ALT 27 0 - 44 U/L   Alkaline Phosphatase 80 38 - 126 U/L   Total Bilirubin 0.8 0.3 - 1.2 mg/dL   GFR calc non Af Amer >60 >60 mL/min   GFR calc Af Amer  >60 >60 mL/min    Comment: (NOTE) The eGFR has been calculated using the CKD EPI equation. This calculation has not been validated in all clinical situations. eGFR's persistently <60 mL/min signify possible Chronic Kidney Disease.    Anion gap 11 5 - 15    Comment: Performed at [facility] [address], [place], [place] [address]  CBC     Status: Abnormal   Collection Time: 01/31/18  8:16 AM  Result Value Ref Range   WBC 17.0 (H) 4.0 - 10.5 K/uL   RBC 5.43 4.22 - 5.81 MIL/uL   Hemoglobin 14.7 13.0 - 17.0 g/dL   HCT 45.7 39.0 - 52.0 %   MCV 84.2 78.0 - 100.0 fL   MCH 27.1 26.0 - 34.0 pg   MCHC 32.2 30.0 - 36.0 g/dL   RDW 14.1 11.5 - 15.5 %   Platelets 281 150 - 400 K/uL    Comment: Performed at [facility], [place] [address] [place], [place] [address]  I-Stat CG4 Lactic Acid, ED     Status: Abnormal   Collection Time: 01/31/18  8:35 AM  Result Value Ref Range   Lactic Acid, Venous 1.96 (H) 0.5 - 1.9 mmol/L  Urinalysis, Routine w reflex microscopic     Status: Abnormal   Collection Time: 01/31/18  9:15 AM  Result Value Ref Range   Color, Urine YELLOW YELLOW   APPearance CLEAR CLEAR   Specific Gravity, Urine 1.020 1.005 - 1.030   pH 6.0 5.0 - 8.0   Glucose, UA NEGATIVE NEGATIVE mg/dL   Hgb urine dipstick LARGE (A) NEGATIVE   Bilirubin Urine NEGATIVE NEGATIVE   Ketones, ur NEGATIVE NEGATIVE mg/dL   Protein, ur NEGATIVE NEGATIVE mg/dL   Nitrite NEGATIVE NEGATIVE   Leukocytes, UA NEGATIVE NEGATIVE   RBC / HPF 21-50 0 - 5 RBC/hpf   WBC, UA 0-5 0 - 5 WBC/hpf   Bacteria, UA RARE (A) NONE SEEN   Squamous Epithelial / LPF 0-5 0 - 5   Mucus PRESENT     Comment: Performed at [facility], [address], [place], [place] [address]   Ct Abdomen Pelvis W Contrast  Result Date: 01/31/2018 CLINICAL DATA:  Abdominal pain with history of diverticulitis. EXAM: CT ABDOMEN AND PELVIS WITH CONTRAST TECHNIQUE: Multidetector CT imaging of the abdomen and pelvis was  performed using the standard protocol following bolus administration of intravenous contrast. CONTRAST:  100 cc Isovue-300 intravenous COMPARISON:  10/02/2017 FINDINGS: Lower chest:  No contributory findings. Hepatobiliary: No focal liver abnormality.No evidence of biliary obstruction or stone. Pancreas: Unremarkable. Spleen: Unremarkable. Adrenals/Urinary Tract: Stable mild thickening of the left adrenal medial limb, suspect underlying adenoma. No hydronephrosis or stone. Unremarkable bladder. Stomach/Bowel: Focal distal descending low-density colonic wall thickening with pericolonic inflammation and few bubbles of gas extraluminal posterior to a thickened diverticulum. No pneumoperitoneum or abscess. Diverticulosis is seen both on the ascending and left colon. Vascular/Lymphatic: No acute vascular abnormality. No mass or adenopathy. Reproductive:No pathologic findings. Other: No ascites or pneumoperitoneum. Musculoskeletal: Notable disc degeneration at L5-S1, likely accelerated by bilateral L5 pars defects. Disc height loss and bulging presumably impinges on the bilateral L5 foraminal nerves. IMPRESSION: Descending diverticulitis. Small volume extraluminal gas without abscess or pneumoperitoneum. Electronically Signed   By: Monte Fantasia M.D.   On: 01/31/2018 10:45      Assessment/Plan Diverticulitis with microperforation The patient has responded well to fluid hydration with a decrease in his heart rate.  His fevers has come down with antipyretics.  He is quite tender on exam, but with no frank peritonitis.  He has no abscess or phlegmon.  We will treat conservatively first with NPO x ice, IV abx therapy.  Recheck labs in the am.  Hopefully he will not require an operation, but he has been told that if he worsens he may require a Hartman's.  He understands.  HTN DM  FEN - NPO x ice VTE - none ordered yet, full orders not in place yet ID - Rocephin/Flagyl x1 dose, zosyn 8/22 --[place],  [facility] 01/31/2018, 2:11 PM Pager: [phone]

## 2018-01-31 NOTE — ED Provider Notes (Signed)
[facility] EMERGENCY DEPARTMENT Provider Note   CSN: [id number] Arrival date & time: 01/31/18  0801     History   Chief Complaint Chief Complaint  Patient presents with  . Abdominal Pain    HPI Andre Reynolds is a 37 y.o. male.  Andre Reynolds is a 37 y.o. Male with a history of hypertension, diabetes, asthma and diverticulitis, who presents to the emergency department for evaluation of left lower quadrant abdominal pain that began yesterday evening.  Patient reports the pain was initially mild, but overnight has become much more severe, he reports sharp constant pain in the left lower quadrant, worse with movement and palpation.  Since onset pain has been constant and progressively worsening.  He reports associated nausea with a few episodes of nonbloody emesis.  No diarrhea, has been constipated last bowel movement was yesterday morning, no melena or hematochezia.  Reports subjective fevers and cold chills, and is febrile on arrival.  He denies dysuria or urinary frequency.  No flank pain.  Patient reports this feels like similar diverticulitis infections he has had in the past, no prior history of abdominal surgeries.  No chest pain or shortness of breath, no cough.     Past Medical History:  Diagnosis Date  . Asthma   . Diabetes mellitus ([place])   . DIVERTICULITIS, HX OF 08/03/2009   Qualifier: Diagnosis of  By: Vanessa Kick MD, Saralyn Pilar    . Hypertension   . Pneumonia     Patient Active Problem List   Diagnosis Date Noted  . Microscopic hematuria 07/26/2017  . Type 2 diabetes mellitus ([place]) 07/13/2017  . Essential hypertension 07/04/2017  . Severe obesity (BMI >= 40) ([place]) 02/19/2017  . Diverticulosis of colon 02/19/2017  . Tobacco use disorder 02/19/2017  . Meralgia paresthetica, left 06/07/2016    Past Surgical History:  Procedure Laterality Date  . WISDOM TOOTH EXTRACTION    . WRIST SURGERY          Home Medications    Prior to Admission  medications   Medication Sig Start Date End Date Taking? Authorizing Provider  acetaminophen (TYLENOL) 500 MG tablet Take 1 tablet (500 mg total) by mouth every 6 (six) hours as needed. 09/22/16  Yes Diallo, Abdoulaye, MD  amLODipine (NORVASC) 5 MG tablet TAKE 1 TABLET BY MOUTH EVERY DAY Patient taking differently: Take 5 mg by mouth daily.  01/01/18  Yes Bonnita Hollow, MD  atorvastatin (LIPITOR) 10 MG tablet Take 1 tablet (10 mg total) by mouth daily. 08/01/17  Yes Smiley Houseman, MD  [employer] VITAMIN B12 1000 MCG tablet TAKE 1 TABLET BY MOUTH EVERY DAY Patient taking differently: Take 1,000 mcg by mouth daily.  12/28/17  Yes Bonnita Hollow, MD  LYRICA 50 MG capsule TAKE 1 CAPSULE (50 MG TOTAL) BY MOUTH 2 (TWO) TIMES DAILY. Patient taking differently: Take 50 mg by mouth at bedtime.  01/25/18  Yes Bonnita Hollow, MD  metFORMIN (GLUCOPHAGE-XR) 500 MG 24 hr tablet TAKE 2 TABLETS BY MOUTH DAILY WITH BREAKFAST. THEN INCREASE TO 4 TABLETS AFTER 1 WEEK. Patient taking differently: Take 1,000 mg by mouth daily at 12 noon.  12/03/17  Yes Smiley Houseman, MD  Blood Glucose Monitoring Suppl (ACCU-CHEK COMPACT CARE KIT) KIT Use to check sugar three times a day 07/19/17   Tonette Bihari, MD  glucose blood (ACCU-CHEK COMPACT PLUS) test strip Use to check sugar three times a day 07/13/17   Smiley Houseman, MD  HYDROcodone-acetaminophen (  NORCO/VICODIN) 5-325 MG tablet Take 2 tablets by mouth every 4 (four) hours as needed. Patient not taking: Reported on 01/14/2018 07/07/17   Fransico Meadow, PA-C  ibuprofen (ADVIL,MOTRIN) 600 MG tablet Take 1 tablet (600 mg total) by mouth every 6 (six) hours as needed for mild pain or moderate pain. Patient not taking: Reported on 01/31/2018 09/22/16   Marjie Skiff, MD  Lancet Devices (LANCING DEVICE) MISC Use to check sugar three times a day 07/16/17   Tonette Bihari, MD  Lancets (FREESTYLE) lancets Use to check sugar three times a day 07/19/17   Tonette Bihari, MD    Family History Family History  Problem Relation Age of Onset  . Diabetes Mother   . Alcohol abuse Mother   . Asthma Mother   . Hypertension Mother   . Breast cancer Mother   . Diabetes Father   . Drug abuse Sister   . Diabetes Maternal Grandmother   . Diabetes Maternal Aunt   . Diabetes Cousin        maternal cousin    Social History Social History   Tobacco Use  . Smoking status: Current Every Day Smoker    Packs/day: 0.50    Years: 20.00    Pack years: 10.00  . Smokeless tobacco: Never Used  Substance Use Topics  . Alcohol use: Yes    Comment: 2-3 beer socially  . Drug use: Yes    Types: Marijuana     Allergies   Patient has no known allergies.   Review of Systems Review of Systems  Constitutional: Positive for chills and fever.  HENT: Negative.   Eyes: Negative for visual disturbance.  Respiratory: Negative for cough and shortness of breath.   Cardiovascular: Negative for chest pain.  Gastrointestinal: Positive for abdominal pain, constipation, nausea and vomiting. Negative for blood in stool and diarrhea.  Genitourinary: Negative for flank pain, frequency and hematuria.  Musculoskeletal: Negative for arthralgias, back pain and myalgias.  Skin: Negative for color change and rash.  Neurological: Negative for dizziness, syncope and light-headedness.     Physical Exam Updated Vital Signs BP 114/73 (BP Location: Right Arm)   Pulse (!) 111   Temp (!) 101.2 F (38.4 C) (Oral)   Resp 18   SpO2 100%   Physical Exam  Constitutional: He is oriented to person, place, and time. He appears well-developed and well-nourished. No distress.  Patient appears moderately ill and uncomfortable on exam.  HENT:  Head: Normocephalic and atraumatic.  Mouth/Throat: Oropharynx is clear and moist.  Eyes: Right eye exhibits no discharge. Left eye exhibits no discharge.  Neck: Neck supple.  Cardiovascular: Normal rate, regular rhythm, normal heart  sounds and intact distal pulses.  Pulmonary/Chest: Effort normal and breath sounds normal. No respiratory distress.  Respirations equal and unlabored, patient able to speak in full sentences, lungs clear to auscultation bilaterally  Abdominal: Soft. Normal appearance and bowel sounds are normal. There is tenderness in the left lower quadrant. There is guarding. There is no CVA tenderness.  Abdomen soft, nondistended, no prior surgical scars noted, bowel sounds present throughout, patient is exquisitely tender in the left lower quadrant with guarding and rebound tenderness, all other quadrants unremarkable, no CVA tenderness  Musculoskeletal: He exhibits no edema or deformity.  Neurological: He is alert and oriented to person, place, and time. Coordination normal.  Skin: Skin is warm and dry. Capillary refill takes less than 2 seconds. He is not diaphoretic.  Psychiatric: He has a normal mood  and affect. His behavior is normal.  Nursing note and vitals reviewed.    ED Treatments / Results  Labs (all labs ordered are listed, but only abnormal results are displayed) Labs Reviewed  COMPREHENSIVE METABOLIC PANEL - Abnormal; Notable for the following components:      Result Value   CO2 21 (*)    Glucose, Bld 150 (*)    All other components within normal limits  CBC - Abnormal; Notable for the following components:   WBC 17.0 (*)    All other components within normal limits  URINALYSIS, ROUTINE W REFLEX MICROSCOPIC - Abnormal; Notable for the following components:   Hgb urine dipstick LARGE (*)    Bacteria, UA RARE (*)    All other components within normal limits  I-STAT CG4 LACTIC ACID, ED - Abnormal; Notable for the following components:   Lactic Acid, Venous 1.96 (*)    All other components within normal limits  CULTURE, BLOOD (ROUTINE X 2)  CULTURE, BLOOD (ROUTINE X 2)  URINE CULTURE  LIPASE, BLOOD  I-STAT CG4 LACTIC ACID, ED    EKG EKG Interpretation  Date/Time:  Thursday  January 31 2018 09:14:43 EDT Ventricular Rate:  111 PR Interval:    QRS Duration: 86 QT Interval:  331 QTC Calculation: 450 R Axis:   43 Text Interpretation:  Sinus tachycardia When compared to prior, similar tachycardia.  No STEMI Confirmed by Antony Blackbird [phone]) on 01/31/2018 10:28:57 AM   Radiology Ct Abdomen Pelvis W Contrast  Result Date: 01/31/2018 CLINICAL DATA:  Abdominal pain with history of diverticulitis. EXAM: CT ABDOMEN AND PELVIS WITH CONTRAST TECHNIQUE: Multidetector CT imaging of the abdomen and pelvis was performed using the standard protocol following bolus administration of intravenous contrast. CONTRAST:  100 cc Isovue-300 intravenous COMPARISON:  10/02/2017 FINDINGS: Lower chest:  No contributory findings. Hepatobiliary: No focal liver abnormality.No evidence of biliary obstruction or stone. Pancreas: Unremarkable. Spleen: Unremarkable. Adrenals/Urinary Tract: Stable mild thickening of the left adrenal medial limb, suspect underlying adenoma. No hydronephrosis or stone. Unremarkable bladder. Stomach/Bowel: Focal distal descending low-density colonic wall thickening with pericolonic inflammation and few bubbles of gas extraluminal posterior to a thickened diverticulum. No pneumoperitoneum or abscess. Diverticulosis is seen both on the ascending and left colon. Vascular/Lymphatic: No acute vascular abnormality. No mass or adenopathy. Reproductive:No pathologic findings. Other: No ascites or pneumoperitoneum. Musculoskeletal: Notable disc degeneration at L5-S1, likely accelerated by bilateral L5 pars defects. Disc height loss and bulging presumably impinges on the bilateral L5 foraminal nerves. IMPRESSION: Descending diverticulitis. Small volume extraluminal gas without abscess or pneumoperitoneum. Electronically Signed   By: Monte Fantasia M.D.   On: 01/31/2018 10:45    Procedures .Critical Care Performed by: Jacqlyn Larsen, PA-C Authorized by: Jacqlyn Larsen, PA-C    Critical care provider statement:    Critical care time (minutes):  45   Critical care start time:  01/31/2018 8:55 AM   Critical care end time:  01/31/2018 9:35 AM   Critical care time was exclusive of:  Separately billable procedures and treating other patients   Critical care was necessary to treat or prevent imminent or life-threatening deterioration of the following conditions:  Sepsis   Critical care was time spent personally by me on the following activities:  Discussions with consultants, evaluation of patient's response to treatment, examination of patient, ordering and performing treatments and interventions, ordering and review of laboratory studies, ordering and review of radiographic studies, pulse oximetry, re-evaluation of patient's condition, obtaining history from patient or surrogate and  review of old charts   I assumed direction of critical care for this patient from another provider in my specialty: no     (including critical care time)  Medications Ordered in ED Medications  cefTRIAXone (ROCEPHIN) 2 g in sodium chloride 0.9 % 100 mL IVPB (2 g Intravenous New Bag/Given 01/31/18 0934)  metroNIDAZOLE (FLAGYL) IVPB 500 mg (500 mg Intravenous New Bag/Given 01/31/18 0944)  0.9 %  sodium chloride infusion (has no administration in time range)  acetaminophen (TYLENOL) tablet 650 mg (650 mg Oral Given 01/31/18 0813)  sodium chloride 0.9 % bolus 1,000 mL (1,000 mLs Intravenous New Bag/Given 01/31/18 0931)  ondansetron (ZOFRAN) injection 4 mg (4 mg Intravenous Given 01/31/18 0936)  morphine 4 MG/ML injection 4 mg (4 mg Intravenous Given 01/31/18 0938)  iopamidol (ISOVUE-300) 61 % injection (100 mLs Intravenous Contrast Given 01/31/18 1035)     Initial Impression / Assessment and Plan / ED Course  I have reviewed the triage vital signs and the nursing notes.  Pertinent labs & imaging results that were available during my care of the patient were reviewed by me and considered in my  medical decision making (see chart for details).  Patient presents for left lower quadrant abdominal pain since yesterday with associated nausea and vomiting, as well as constipation.  On arrival patient is febrile to 102.4 and tachycardic at 125, abdominal labs initiated from triage significant for leukocytosis of 17.0.  Patient meets sepsis criteria with likely intra-abdominal source, code sepsis initiated, initial lactic acid is reassuring at 1.96 and does not suggest severe sepsis will initiate 1 L fluid bolus and maintenance fluids, but hold off on 30 cc/kg bolus.  Patient started on Rocephin and Flagyl for suspected intra-abdominal source per sepsis order set.  Blood cultures ordered, and awaiting the return of the rest patient's lab work.  On exam he is extremely tender with guarding and rebound tenderness in the left lower quadrant, with known history of diverticulitis, and reports similar presentation in the past.  Will get CT abdomen pelvis with contrast.  No other acute infectious symptoms noted.  No acute electrolyte derangements, glucose 150, normal renal and liver function and normal lipase.  Pikus count of 17.0, but normal hemoglobin.  Urinalysis with large amount of hemoglobin with 21-50 RBCs present, no leukocytes, and rare bacteria.  On chart review patient does have history of hematuria noted on urinalysis, no focal flank tenderness, I have low suspicion for renal stone.  Urine culture collected.  CT shows descending diverticulitis with small amount of extraluminal gas but no obvious abscess or pneumoperitoneum.  No hydronephrosis or renal stone noted in the setting of hematuria.  Will discuss with with general surgery and plan for medicine admission for continued treatment with IV antibiotics given extraluminal air on CT and that patient came in septic.   Case discussed with Claiborne Billings with general surgery, who will see and evaluate the patient.  Recommends addition of Zosyn to antibiotic  regimen.  Will discuss with family medicine for admission.  Spoke with family medicine resident who will see and admit the patient.  Final Clinical Impressions(s) / ED Diagnoses   Final diagnoses:  Diverticulitis  Sepsis, due to unspecified organism [facility])    ED Discharge Orders    None       Jacqlyn Larsen, [place] 01/31/18 1138    Tegeler, Gwenyth Allegra, MD 01/31/18 1538

## 2018-01-31 NOTE — Progress Notes (Signed)
Pharmacy Antibiotic Note  Andre MalletQuentin T Reynolds is a 37 y.o. male admitted on 01/31/2018 with intra-abdominal infection/sepsis.  Pharmacy has been consulted for Zosyn dosing. Received ceftriaxone/flagyl in the ED. Tmax 102.4, wbc 17, LA 1.96. SCr 1.08 on admit.  Plan: D/c ceftriaxone/Flagyl Start Zosyn 3.375g IV Q8h (4h infusion) Monitor clinical progress, c/s, renal function F/u de-escalation plan/LOT       Temp (24hrs), Avg:101.1 F (38.4 C), Min:99.8 F (37.7 C), Max:102.4 F (39.1 C)  Recent Labs  Lab 01/31/18 0816 01/31/18 0835  WBC 17.0*  --   CREATININE 1.08  --   LATICACIDVEN  --  1.96*    CrCl cannot be calculated (Unknown ideal weight.).    No Known Allergies  Andre BertinHaley Ruthellen Reynolds, PharmD, BCPS Clinical Pharmacist Clinical phone [phone] Please check AMION for all [facility] contact numbers 01/31/2018 11:42 AM

## 2018-01-31 NOTE — ED Triage Notes (Signed)
Pt reports abdominal pain with headache and vomiting that started last night. Hx of diverticulitis.

## 2018-02-01 ENCOUNTER — Encounter (HOSPITAL_COMMUNITY): Payer: Self-pay | Admitting: *Deleted

## 2018-02-01 LAB — CBC
HEMATOCRIT: 40.4 % (ref 39.0–52.0)
HEMOGLOBIN: 13 g/dL (ref 13.0–17.0)
MCH: 27 pg (ref 26.0–34.0)
MCHC: 32.2 g/dL (ref 30.0–36.0)
MCV: 83.8 fL (ref 78.0–100.0)
Platelets: 198 10*3/uL (ref 150–400)
RBC: 4.82 MIL/uL (ref 4.22–5.81)
RDW: 13.8 % (ref 11.5–15.5)
WBC: 12.8 10*3/uL — AB (ref 4.0–10.5)

## 2018-02-01 LAB — COMPREHENSIVE METABOLIC PANEL
ALK PHOS: 59 U/L (ref 38–126)
ALT: 19 U/L (ref 0–44)
AST: 10 U/L — AB (ref 15–41)
Albumin: 3.3 g/dL — ABNORMAL LOW (ref 3.5–5.0)
Anion gap: 10 (ref 5–15)
BILIRUBIN TOTAL: 1.4 mg/dL — AB (ref 0.3–1.2)
BUN: 7 mg/dL (ref 6–20)
CALCIUM: 8.4 mg/dL — AB (ref 8.9–10.3)
CHLORIDE: 105 mmol/L (ref 98–111)
CO2: 22 mmol/L (ref 22–32)
CREATININE: 1.17 mg/dL (ref 0.61–1.24)
GFR calc Af Amer: >60 mL/min (ref >60)
Glucose, Bld: 111 mg/dL — ABNORMAL HIGH (ref 70–99)
Potassium: 3.6 mmol/L (ref 3.5–5.1)
Sodium: 137 mmol/L (ref 135–145)
Total Protein: 6.3 g/dL — ABNORMAL LOW (ref 6.5–8.1)

## 2018-02-01 LAB — URINE CULTURE: Culture: NO GROWTH

## 2018-02-01 LAB — GLUCOSE, CAPILLARY
GLUCOSE-CAPILLARY: 132 mg/dL — AB (ref 70–99)
GLUCOSE-CAPILLARY: 168 mg/dL — AB (ref 70–99)
GLUCOSE-CAPILLARY: 135 mg/dL — AB (ref 70–99)
Glucose-Capillary: 108 mg/dL — ABNORMAL HIGH (ref 70–99)

## 2018-02-01 LAB — HEMOGLOBIN A1C
HEMOGLOBIN A1C: 7 % — AB (ref 4.8–5.6)
MEAN PLASMA GLUCOSE: 154.2 mg/dL

## 2018-02-01 LAB — HIV ANTIBODY (ROUTINE TESTING W REFLEX): HIV SCREEN 4TH GENERATION: NONREACTIVE

## 2018-02-01 MED ORDER — PNEUMOCOCCAL VAC POLYVALENT 25 MCG/0.5ML IJ INJ
0.5000 mL | INJECTION | INTRAMUSCULAR | Status: AC
  Administered 2018-02-02: 0.5 mL via INTRAMUSCULAR
  Filled 2018-02-01: qty 0.5

## 2018-02-01 NOTE — Discharge Instructions (Signed)
Diverticulitis °Diverticulitis is when small pockets in your large intestine (colon) get infected or swollen. This causes stomach pain and watery poop (diarrhea). °These pouches are called diverticula. They form in people who have a condition called diverticulosis. °Follow these instructions at home: °Medicines °· Take over-the-counter and prescription medicines only as told by your doctor. These include: °? Antibiotics. °? Pain medicines. °? Fiber pills. °? Probiotics. °? Stool softeners. °· Do not drive or use heavy machinery while taking prescription pain medicine. °· If you were prescribed an antibiotic, take it as told. Do not stop taking it even if you feel better. °General instructions °· Follow a diet as told by your doctor. °· When you feel better, your doctor may tell you to change your diet. You may need to eat a lot of fiber. Fiber makes it easier to poop (have bowel movements). Healthy foods with fiber include: °? Berries. °? Beans. °? Lentils. °? Green vegetables. °· Exercise 3 or more times a week. Aim for 30 minutes each time. Exercise enough to sweat and make your heart beat faster. °· Keep all follow-up visits as told. This is important. You may need to have an exam of the large intestine. This is called a colonoscopy. °Contact a doctor if: °· Your pain does not get better. °· You have a hard time eating or drinking. °· You are not pooping like normal. °Get help right away if: °· Your pain gets worse. °· Your problems do not get better. °· Your problems get worse very fast. °· You have a fever. °· You throw up (vomit) more than one time. °· You have poop that is: °? Bloody. °? Black. °? Tarry. °Summary °· Diverticulitis is when small pockets in your large intestine (colon) get infected or swollen. °· Take medicines only as told by your doctor. °· Follow a diet as told by your doctor. °This information is not intended to replace advice given to you by your health care provider. Make sure you discuss  any questions you have with your health care provider. °Document Released: 11/15/2007 Document Revised: 06/15/2016 Document Reviewed: 06/15/2016 °Elsevier Interactive Patient Education © 2017 Elsevier Inc. ° ° ° ° °Low-Fiber Diet °Fiber is found in fruits, vegetables, and whole grains. A low-fiber diet restricts fibrous foods that are not digested in the small intestine. A diet containing about 10-15 grams of fiber per day is considered low fiber. Low-fiber diets may be used to: °· Promote healing and rest the bowel during intestinal flare-ups. °· Prevent blockage of a partially obstructed or narrowed gastrointestinal tract. °· Reduce fecal weight and volume. °· Slow the movement of feces. ° °You may be on a low-fiber diet as a transitional diet following surgery, after an injury (trauma), or because of a short (acute) or lifelong (chronic) illness. Your health care provider will determine the length of time you need to stay on this diet. °What do I need to know about a low-fiber diet? °Always check the fiber content on the packaging's Nutrition Facts label, especially on foods from the grains list. Ask your dietitian if you have questions about specific foods that are related to your condition, especially if the food is not listed below. In general, a low-fiber food will have less than 2 g of fiber. °What foods can I eat? °Grains °All breads and crackers made with [place] flour. Sweet rolls, doughnuts, waffles, pancakes, French toast, bagels. Pretzels, Melba toast, zwieback. Well-cooked cereals, such as cornmeal, farina, or cream cereals. Dry cereals that   do not contain whole grains, fruit, or nuts, such as refined corn, wheat, rice, and oat cereals. Potatoes prepared any way without skins, plain pastas and noodles, refined Hollen rice. Use Peak flour for baking and making sauces. Use allowed list of grains for casseroles, dumplings, and puddings. °Vegetables °Strained tomato and vegetable juices. Fresh lettuce,  cucumber, spinach. Well-cooked (no skin or pulp) or canned vegetables, such as asparagus, bean sprouts, beets, carrots, green beans, mushrooms, potatoes, pumpkin, spinach, yellow squash, tomato sauce/puree, turnips, yams, and zucchini. Keep servings limited to ½ cup. °Fruits °All fruit juices except prune juice. Cooked or canned fruits without skin and seeds, such as applesauce, apricots, cherries, fruit cocktail, grapefruit, grapes, mandarin oranges, melons, peaches, pears, pineapple, and plums. Fresh fruits without skin, such as apricots, avocados, bananas, melons, pineapple, nectarines, and peaches. Keep servings limited to ½ cup or 1 piece. °Meat and Other Protein Sources °Ground or well-cooked tender beef, ham, veal, lamb, pork, or poultry. Eggs, plain cheese. Fish, oysters, shrimp, lobster, and other seafood. Liver, organ meats. Smooth nut butters. °Dairy °All milk products and alternative dairy substitutes, such as soy, rice, almond, and coconut, not containing added whole nuts, seeds, or added fruit. °Beverages °Decaf coffee, fruit, and vegetable juices or smoothies (small amounts, with no pulp or skins, and with fruits from allowed list), sports drinks, herbal tea. °Condiments °Ketchup, mustard, vinegar, cream sauce, cheese sauce, cocoa powder. Spices in moderation, such as allspice, basil, bay leaves, celery powder or leaves, cinnamon, cumin powder, curry powder, ginger, mace, marjoram, onion or garlic powder, oregano, paprika, parsley flakes, ground pepper, rosemary, sage, savory, tarragon, thyme, and turmeric. °Sweets and Desserts °Plain cakes and cookies, pie made with allowed fruit, pudding, custard, cream pie. Gelatin, fruit, ice, sherbet, frozen ice pops. Ice cream, ice milk without nuts. Plain hard candy, honey, jelly, molasses, syrup, sugar, chocolate syrup, gumdrops, marshmallows. Limit overall sugar intake. °Fats and Oil °Margarine, butter, cream, mayonnaise, salad oils, plain salad dressings  made from allowed foods. Choose healthy fats such as olive oil, canola oil, and omega-3 fatty acids (such as found in salmon or tuna) when possible. °Other °Bouillon, broth, or cream soups made from allowed foods. Any strained soup. Casseroles or mixed dishes made with allowed foods. °The items listed above may not be a complete list of recommended foods or beverages. Contact your dietitian for more options. °What foods are not recommended? °Grains °All whole wheat and whole grain breads and crackers. Multigrains, rye, bran seeds, nuts, or coconut. Cereals containing whole grains, multigrains, bran, coconut, nuts, raisins. Cooked or dry oatmeal, steel-cut oats. Coarse wheat cereals, granola. Cereals advertised as high fiber. Potato skins. Whole grain pasta, wild or brown rice. Popcorn. Coconut flour. Bran, buckwheat, corn bread, multigrains, rye, wheat germ. °Vegetables °Fresh, cooked or canned vegetables, such as artichokes, asparagus, beet greens, broccoli, Brussels sprouts, cabbage, celery, cauliflower, corn, eggplant, kale, legumes or beans, okra, peas, and tomatoes. Avoid large servings of any vegetables, especially raw vegetables. °Fruits °Fresh fruits, such as apples with or without skin, berries, cherries, figs, grapes, grapefruit, guavas, kiwis, mangoes, oranges, papayas, pears, persimmons, pineapple, and pomegranate. Prune juice and juices with pulp, stewed or dried prunes. Dried fruits, dates, raisins. Fruit seeds or skins. Avoid large servings of all fresh fruits. °Meats and Other Protein Sources °Tough, fibrous meats with gristle. Chunky nut butter. Cheese made with seeds, nuts, or other foods not recommended. Nuts, seeds, legumes (beans, including baked beans), dried peas, beans, lentils. °Dairy °Yogurt or cheese that contains nuts, seeds,   or added fruit. °Beverages °Fruit juices with high pulp, prune juice. Caffeinated coffee and teas. °Condiments °Coconut, maple syrup, pickles, olives. °Sweets and  Desserts °Desserts, cookies, or candies that contain nuts or coconut, chunky peanut butter, dried fruits. Jams, preserves with seeds, marmalade. Large amounts of sugar and sweets. Any other dessert made with fruits from the not recommended list. °Other °Soups made from vegetables that are not recommended or that contain other foods not recommended. °The items listed above may not be a complete list of foods and beverages to avoid. Contact your dietitian for more information. °This information is not intended to replace advice given to you by your health care provider. Make sure you discuss any questions you have with your health care provider. °Document Released: 11/18/2001 Document Revised: 11/04/2015 Document Reviewed: 04/21/2013 °Elsevier Interactive Patient Education © 2017 Elsevier Inc. ° ° °High-Fiber Diet °Fiber, also called dietary fiber, is a type of carbohydrate found in fruits, vegetables, whole grains, and beans. A high-fiber diet can have many health benefits. Your health care provider may recommend a high-fiber diet to help: °· Prevent constipation. Fiber can make your bowel movements more regular. °· Lower your cholesterol. °· Relieve hemorrhoids, uncomplicated diverticulosis, or irritable bowel syndrome. °· Prevent overeating as part of a weight-loss plan. °· Prevent heart disease, type 2 diabetes, and certain cancers. ° °What is my plan? °The recommended daily intake of fiber includes: °· 38 grams for men under age 50. °· 30 grams for men over age 50. °· 25 grams for women under age 50. °· 21 grams for women over age 50. ° °You can get the recommended daily intake of dietary fiber by eating a variety of fruits, vegetables, grains, and beans. Your health care provider may also recommend a fiber supplement if it is not possible to get enough fiber through your diet. °What do I need to know about a high-fiber diet? °· Fiber supplements have not been widely studied for their effectiveness, so it is  better to get fiber through food sources. °· Always check the fiber content on the nutrition facts label of any prepackaged food. Look for foods that contain at least 5 grams of fiber per serving. °· Ask your dietitian if you have questions about specific foods that are related to your condition, especially if those foods are not listed in the following section. °· Increase your daily fiber consumption gradually. Increasing your intake of dietary fiber too quickly may cause bloating, cramping, or gas. °· Drink plenty of water. Water helps you to digest fiber. °What foods can I eat? °Grains °Whole-grain breads. Multigrain cereal. Oats and oatmeal. Brown rice. Barley. Bulgur wheat. Millet. Bran muffins. Popcorn. Rye wafer crackers. °Vegetables °Sweet potatoes. Spinach. Kale. Artichokes. Cabbage. Broccoli. Green peas. Carrots. Squash. °Fruits °Berries. Pears. Apples. Oranges. Avocados. Prunes and raisins. Dried figs. °Meats and Other Protein Sources °Navy, kidney, pinto, and soy beans. Split peas. Lentils. Nuts and seeds. °Dairy °Fiber-fortified yogurt. °Beverages °Fiber-fortified soy milk. Fiber-fortified orange juice. °Other °Fiber bars. °The items listed above may not be a complete list of recommended foods or beverages. Contact your dietitian for more options. °What foods are not recommended? °Grains °Hayes bread. Pasta made with refined flour. Rightmyer rice. °Vegetables °Fried potatoes. Canned vegetables. Well-cooked vegetables. °Fruits °Fruit juice. Cooked, strained fruit. °Meats and Other Protein Sources °Fatty cuts of meat. Fried poultry or fried fish. °Dairy °Milk. Yogurt. Cream cheese. Sour cream. °Beverages °Soft drinks. °Other °Cakes and pastries. Butter and oils. °The items listed above may not be   a complete list of foods and beverages to avoid. Contact your dietitian for more information. °What are some tips for including high-fiber foods in my diet? °· Eat a wide variety of high-fiber foods. °· Make sure  that half of all grains consumed each day are whole grains. °· Replace breads and cereals made from refined flour or Ator flour with whole-grain breads and cereals. °· Replace Borger rice with brown rice, bulgur wheat, or millet. °· Start the day with a breakfast that is high in fiber, such as a cereal that contains at least 5 grams of fiber per serving. °· Use beans in place of meat in soups, salads, or pasta. °· Eat high-fiber snacks, such as berries, raw vegetables, nuts, or popcorn. °This information is not intended to replace advice given to you by your health care provider. Make sure you discuss any questions you have with your health care provider. °Document Released: 05/29/2005 Document Revised: 11/04/2015 Document Reviewed: 11/11/2013 °Elsevier Interactive Patient Education © 2018 Elsevier Inc. ° °

## 2018-02-01 NOTE — Progress Notes (Signed)
PHARMACY - PHYSICIAN COMMUNICATION CRITICAL VALUE ALERT - BLOOD CULTURE IDENTIFICATION (BCID)  [name] is an 37 y.o. male who [facility]   Assessment:  Patient here with diverticulitis and micro perforation. Now blood cultures with 1/2 Gram positive rods, usually a contaminant  Name of physician (or Provider) Contacted: N/A  Current antibiotics: Zosyn  Changes to prescribed antibiotics recommended:  Patient is on recommended antibiotics - No changes needed   Baldemar FridayMasters, Ellah Otte M 02/01/2018  7:09 PM

## 2018-02-01 NOTE — Progress Notes (Signed)
Patient ID: Andre Reynolds, male   DOB: February 14, 1981, 37 y.o.   MRN: [id number]       Subjective: Patient feels much better.  His pain has resolved.  Still had fevers around 100-101 last night.  No further nausea.  Objective: Vital signs in last 24 hours: Temp:  [98.6 F (37 C)-101.2 F (38.4 C)] 98.6 F (37 C) (08/23 0506) Pulse Rate:  [93-111] 100 (08/23 0506) Resp:  [17-28] 17 (08/23 0506) BP: (111-128)/(64-85) 128/85 (08/23 0506) SpO2:  [88 %-100 %] 88 % (08/23 0506) Weight:  [135.5 kg] 135.5 kg (08/22 1430) Last BM Date: 01/31/18  Intake/Output from previous day: 08/22 0701 - 08/23 0700 In: 660 [P.O.:60; I.V.:400; IV Piggyback:200] Out: -  Intake/Output this shift: No intake/output data recorded.  PE: Heart: regular Lungs: CTAB Abd: soft, NT, ND, obese, +BS  Lab Results:  Recent Labs    01/31/18 0816 02/01/18 0544  WBC 17.0* 12.8*  HGB 14.7 13.0  HCT 45.7 40.4  PLT 281 198   BMET Recent Labs    01/31/18 0816 02/01/18 0544  NA 136 137  K 3.8 3.6  CL 104 105  CO2 21* 22  GLUCOSE 150* 111*  BUN 7 7  CREATININE 1.08 1.17  CALCIUM 8.9 8.4*   PT/INR No results for input(s): LABPROT, INR in the last 72 hours. CMP     Component Value Date/Time   NA 137 02/01/2018 0544   NA 137 07/04/2017 1656   K 3.6 02/01/2018 0544   CL 105 02/01/2018 0544   CO2 22 02/01/2018 0544   GLUCOSE 111 (H) 02/01/2018 0544   BUN 7 02/01/2018 0544   BUN 8 07/04/2017 1656   CREATININE 1.17 02/01/2018 0544   CALCIUM 8.4 (L) 02/01/2018 0544   PROT 6.3 (L) 02/01/2018 0544   ALBUMIN 3.3 (L) 02/01/2018 0544   AST 10 (L) 02/01/2018 0544   ALT 19 02/01/2018 0544   ALKPHOS 59 02/01/2018 0544   BILITOT 1.4 (H) 02/01/2018 0544   GFRNONAA >60 02/01/2018 0544   GFRAA >60 02/01/2018 0544   Lipase     Component Value Date/Time   LIPASE 34 01/31/2018 0816       Studies/Results: Ct Abdomen Pelvis W Contrast  Result Date: 01/31/2018 CLINICAL DATA:  Abdominal pain with  history of diverticulitis. EXAM: CT ABDOMEN AND PELVIS WITH CONTRAST TECHNIQUE: Multidetector CT imaging of the abdomen and pelvis was performed using the standard protocol following bolus administration of intravenous contrast. CONTRAST:  100 cc Isovue-300 intravenous COMPARISON:  10/02/2017 FINDINGS: Lower chest:  No contributory findings. Hepatobiliary: No focal liver abnormality.No evidence of biliary obstruction or stone. Pancreas: Unremarkable. Spleen: Unremarkable. Adrenals/Urinary Tract: Stable mild thickening of the left adrenal medial limb, suspect underlying adenoma. No hydronephrosis or stone. Unremarkable bladder. Stomach/Bowel: Focal distal descending low-density colonic wall thickening with pericolonic inflammation and few bubbles of gas extraluminal posterior to a thickened diverticulum. No pneumoperitoneum or abscess. Diverticulosis is seen both on the ascending and left colon. Vascular/Lymphatic: No acute vascular abnormality. No mass or adenopathy. Reproductive:No pathologic findings. Other: No ascites or pneumoperitoneum. Musculoskeletal: Notable disc degeneration at L5-S1, likely accelerated by bilateral L5 pars defects. Disc height loss and bulging presumably impinges on the bilateral L5 foraminal nerves. IMPRESSION: Descending diverticulitis. Small volume extraluminal gas without abscess or pneumoperitoneum. Electronically Signed   By: Marnee SpringJonathon  Watts M.D.   On: 01/31/2018 10:45    Anti-infectives: Anti-infectives (From admission, onward)   Start     Dose/Rate Route Frequency Ordered Stop  01/31/18 1800  piperacillin-tazobactam (ZOSYN) IVPB 3.375 g     3.375 g 12.5 mL/hr over 240 Minutes Intravenous Every 8 hours 01/31/18 1144     01/31/18 1130  piperacillin-tazobactam (ZOSYN) IVPB 3.375 g  Status:  Discontinued     3.375 g 100 mL/hr over 30 Minutes Intravenous  Once 01/31/18 1117 01/31/18 1119   01/31/18 0845  cefTRIAXone (ROCEPHIN) 2 g in sodium chloride 0.9 % 100 mL IVPB   Status:  Discontinued     2 g 200 mL/hr over 30 Minutes Intravenous Every 24 hours 01/31/18 0834 01/31/18 1144   01/31/18 0845  metroNIDAZOLE (FLAGYL) IVPB 500 mg  Status:  Discontinued     500 mg 100 mL/hr over 60 Minutes Intravenous Every 8 hours 01/31/18 0834 01/31/18 1144       Assessment/Plan Diverticulitis with microperforation -much improved today -fever curve down from 102 to 100-101.  Cont to follow this, but WBC down to 12 today from 17K yesterday -adv to clear liquids today -dietitian consult today to discuss low fiber and high fiber diet for once he is discharged.  HTN DM  FEN - clear liquids VTE - Heparin ID - Rocephin/Flagyl x1 dose, zosyn 8/22 -->   LOS: 1 day    Letha Cape , [facility] 02/01/2018, 8:23 AM Pager: [phone]

## 2018-02-01 NOTE — Plan of Care (Signed)
Nutrition Education Note  RD consulted for nutrition education regarding a Low Fiber/High Fiber diet.   For the first 4-6 weeks post discharge:  RD provided "Low Fiber Nutrition Therapy" handout from the Academy of [employer] and [employer]etics. Discussed nutrition therapy to reduce the irritation of the gastrointestinal tract to promote healing. Provided list of recommended low fiberous foods in comparison to foods with high amounts of fiber, as well as a recommended sample day. Teach back method used.  Once cleared by GI or after the first 4-6 weeks post discharge:  RD provided "High Fiber Nutrition Therapy" handout from the Academy of [employer] and [employer]etics. Discussed nutrition therapy to reduce the irritation of the gastrointestinal tract and to promote healthy bacterial growth. Provided list of recommended high fiberous foods in comparison to foods with low amounts of fiber, as well as a recommended sample day. Discussed prebiotics and probiotics and the foods they can be found in.Teach back method used.  Expect good compliance.  Body mass index is 45.42 kg/m. Pt meets criteria for morbid obesity based on current BMI.  Labs and medications reviewed. No further nutrition interventions warranted at this time. RD contact information provided. If additional nutrition issues arise, please re-consult RD.  Andre Reynolds RD, LDN Clinical Nutrition Pager # [phone]- [phone]

## 2018-02-01 NOTE — Progress Notes (Signed)
ANTIBIOTIC CONSULT NOTE  Pharmacy Consult for Zosyn Indication: diverticulitis  No Known Allergies  Patient Measurements: Height: 5\' 8"  (172.7 cm) Weight: 298 lb 11.6 oz (135.5 kg) IBW/kg (Calculated) : 68.4 Adjusted Body Weight:    Vital Signs: Temp: 98.6 F (37 C) (08/23 0506) Temp Source: Oral (08/23 0506) BP: 128/85 (08/23 0506) Pulse Rate: 100 (08/23 0506) Intake/Output from previous day: 08/22 0701 - 08/23 0700 In: 660 [P.O.:60; I.V.:400; IV Piggyback:200] Out: -  Intake/Output from this shift: No intake/output data recorded.  Labs: Recent Labs    01/31/18 0816 02/01/18 0544  WBC 17.0* 12.8*  HGB 14.7 13.0  PLT 281 198  CREATININE 1.08 1.17   Estimated Creatinine Clearance: 115.3 mL/min (by C-G formula based on SCr of 1.17 mg/dL). No results for input(s): VANCOTROUGH, VANCOPEAK, VANCORANDOM, GENTTROUGH, GENTPEAK, GENTRANDOM, TOBRATROUGH, TOBRAPEAK, TOBRARND, AMIKACINPEAK, AMIKACINTROU, AMIKACIN in the last 72 hours.   Microbiology: No results found for this or any previous visit (from the past 720 hour(s)).  Medical History: Past Medical History:  Diagnosis Date  . Asthma   . Diabetes mellitus (HCC)   . DIVERTICULITIS, HX OF 08/03/2009   Qualifier: Diagnosis of  By: Gilford Rileobbia MD, Luisa HartPatrick    . Hypertension   . Pneumonia     Assessment: ID: Diverticulitis Tmax 102.4, currently afebrile. WBC 12.8 down. Scr 1.17 with CrCl >100  Flagyl x 1 Rocephin x 1 Zosyn 8/22  Goal of Therapy:  Eradication of infection   Plan:  Zosyn 3.375g IV q 8hrs dose ok Pharmacy will sign off. Please reconsult for further dosing assitance.  Amey Hossain S. Merilynn Finlandobertson, PharmD, BCPS Clinical Staff Pharmacist Pager [phone]  Misty Stanleyobertson, Gustavo Meditz Stillinger 02/01/2018,7:55 AM

## 2018-02-01 NOTE — Plan of Care (Signed)
  Problem: Nutrition: Goal: Adequate nutrition will be maintained Outcome: Not Progressing   Problem: Education: Goal: Knowledge of General Education information will improve Description Including pain rating scale, medication(s)/side effects and non-pharmacologic comfort measures Outcome: Progressing   Problem: Pain Managment: Goal: General experience of comfort will improve Outcome: Progressing

## 2018-02-01 NOTE — Progress Notes (Signed)
Family Medicine Teaching Service Daily Progress Note Intern Pager: [phone]  Patient name: Andre Reynolds Medical record number: [id number] Date of birth: 1980/08/27 Age: 37 y.o. Gender: male  Primary Care Provider: Garnette Gunnerhompson, Aaron B, MD Consultants: Surg Code Status: Full  Pt Overview and Major Events to Date:  8/22 admitted   Assessment and Plan: Andre MalletQuentin T Reynolds is a 37 y.o. male presenting with LLQ abdominal pain. PMH is significant for type 2 diabetes mellitus, h/o diverticulosis, HLD and hypertension.   Abdominal pain 2/2 diverticulitis, with micro perforation:  Improving. No longer endorsing abdominal pain, had several normal BM yesterday. Denies N/V. Continue to be febrile (Tmax 100.9 following admit) overnight, otherwise hemodynamically stable. Abdomen non-tender on exam. WBC trending down to 12 today, from 17.  - Surgery following; appreciate recs - Continue Zosyn IV every 8 hours per surgery - Continue mIVF normal saline 100 mL/hr; d/c if tolerating diet well  --Advance to clear liquids this am - Tylenol as needed - Zofran IV 4 mg for nausea --Dietian consult for appropriate diet per surgery   HTN: Normotensive overnight. Takes home on Norvasc 5 mg daily.  -Holding home amlodipine   HLD:  Lipid panel in 07/2017 showing elevated triglycerides at 171.Takes lipitor at home 10 mg daily.  -Hold home atorvastatin   T2DM: Glucose ranging around 120 overnight. Last A1c 7.5 in 09/2017. At home on Metformin XR 1000 mg daily.  -Obtain A1c  -sSSI  -Monitor glucose   Diabetic peripheral neuropathy: Stable, in left leg mainly. Takes Lyrica 50 mg daily.  -Cont home lyrica    Tobacco use:  Smokes 1PPD, 20 pack year history. Offered nicotine patch, pt declines at this time.    FEN/GI: Liquid diet, IV fluids  PPx: Heparin   Disposition: Adv to liquids and monitor, likely d/c tomorrow if continued approval   Subjective:  Patient doing well this morning.  Denies any  further abdominal pain, nausea or vomiting.  Had a few BMs overnight.  Objective: Temp:  [98.6 F (37 C)-102.4 F (39.1 C)] 98.6 F (37 C) (08/23 0506) Pulse Rate:  [93-125] 100 (08/23 0506) Resp:  [16-28] 17 (08/23 0506) BP: (111-128)/(64-85) 128/85 (08/23 0506) SpO2:  [88 %-100 %] 88 % (08/23 0506) Weight:  [135.5 kg] 135.5 kg (08/22 1430) Physical Exam: General: Alert, NAD HEENT: NCAT, MMM, oropharynx nonerythematous  Cardiac: RRR no m/g/r Lungs: Clear bilaterally, no increased WOB  Abdomen: soft, non-tender, non-distended, normoactive BS Msk: Moves all extremities spontaneously  Ext: Warm, dry, 2+ distal pulses, no edema   Laboratory: Recent Labs  Lab 01/31/18 0816  WBC 17.0*  HGB 14.7  HCT 45.7  PLT 281   Recent Labs  Lab 01/31/18 0816  NA 136  K 3.8  CL 104  CO2 21*  BUN 7  CREATININE 1.08  CALCIUM 8.9  PROT 7.1  BILITOT 0.8  ALKPHOS 80  ALT 27  AST 18  GLUCOSE 150*        [address] Farms StreetBeard, Samantha N, DO 02/01/2018, 6:15 AM PGY-1, [facility] FPTS Intern pager: [phone], text pages welcome

## 2018-02-02 LAB — BASIC METABOLIC PANEL
Anion gap: 7 (ref 5–15)
BUN: 5 mg/dL — ABNORMAL LOW (ref 6–20)
CO2: 25 mmol/L (ref 22–32)
Calcium: 8.8 mg/dL — ABNORMAL LOW (ref 8.9–10.3)
Chloride: 107 mmol/L (ref 98–111)
Creatinine, Ser: 1.01 mg/dL (ref 0.61–1.24)
GFR calc Af Amer: >60 mL/min (ref >60)
GFR calc non Af Amer: >60 mL/min (ref >60)
Glucose, Bld: 125 mg/dL — ABNORMAL HIGH (ref 70–99)
Potassium: 3.6 mmol/L (ref 3.5–5.1)
Sodium: 139 mmol/L (ref 135–145)

## 2018-02-02 LAB — CBC WITH DIFFERENTIAL/PLATELET
Abs Immature Granulocytes: 0 10*3/uL (ref 0.0–0.1)
Basophils Absolute: 0 10*3/uL (ref 0.0–0.1)
Basophils Relative: 0 %
Eosinophils Absolute: 0.1 10*3/uL (ref 0.0–0.7)
Eosinophils Relative: 1 %
HCT: 39.3 % (ref 39.0–52.0)
Hemoglobin: 12.7 g/dL — ABNORMAL LOW (ref 13.0–17.0)
Immature Granulocytes: 0 %
Lymphocytes Relative: 26 %
Lymphs Abs: 2.3 10*3/uL (ref 0.7–4.0)
MCH: 27.1 pg (ref 26.0–34.0)
MCHC: 32.3 g/dL (ref 30.0–36.0)
MCV: 84 fL (ref 78.0–100.0)
Monocytes Absolute: 1.2 10*3/uL — ABNORMAL HIGH (ref 0.1–1.0)
Monocytes Relative: 14 %
Neutro Abs: 5.2 10*3/uL (ref 1.7–7.7)
Neutrophils Relative %: 59 %
Platelets: 195 10*3/uL (ref 150–400)
RBC: 4.68 MIL/uL (ref 4.22–5.81)
RDW: 13.7 % (ref 11.5–15.5)
WBC: 8.8 10*3/uL (ref 4.0–10.5)

## 2018-02-02 LAB — GLUCOSE, CAPILLARY
GLUCOSE-CAPILLARY: 156 mg/dL — AB (ref 70–99)
Glucose-Capillary: 99 mg/dL (ref 70–99)

## 2018-02-02 MED ORDER — AMOXICILLIN-POT CLAVULANATE 875-125 MG PO TABS: 1.0000 | ORAL_TABLET | Freq: Two times a day (BID) | ORAL | 0 refills | Status: AC

## 2018-02-02 MED ORDER — AMOXICILLIN-POT CLAVULANATE 875-125 MG PO TABS
1.0000 | ORAL_TABLET | Freq: Two times a day (BID) | ORAL | Status: DC
  Administered 2018-02-02: 1 via ORAL
  Filled 2018-02-02: qty 1

## 2018-02-02 NOTE — Progress Notes (Signed)
Discharged patient home with wife and children. Personal belongings, discharged instructions given to patient . Verbalized understanding of instructions

## 2018-02-02 NOTE — Progress Notes (Signed)
Family Medicine Teaching Service Daily Progress Note Intern Pager: [phone]  Patient name: Andre MalletQuentin T Reynolds Medical record number: [id number] Date of birth: 18-Dec-1980 Age: 37 y.o. [id number]: male  Primary Care Provider: Garnette Gunnerhompson, Aaron B, MD Consultants: Gen surg  Code Status: Full   Assessment and Plan: Andre QuestQuentin T Whiteis a 37 y.o.malepresenting with LLQ abdominal pain. PMH is significant fortype 2 diabetes mellitus,h/odiverticulosis, HLDand hypertension.  Abdominal pain 2/2 diverticulitis,with microperforation: Improved. No further abdominal pain, tolerating full diet well. Afebrile, and hemodynamically stable. Abdomen non-tender on exam. WBC wnl this am. Surgically cleared for discharge.  - d/c'd Zosyn IV; started on Augmentin for 14 additional days per surg --f/u with surgery in 2 weeks  - full diet  - Tylenol as needed - Zofran IV 4 mg for nausea   HTN: Normotensive overnight. Takeshome on Norvasc 5 mg daily.  -Restart home amlodipine   HLD: Lipid panel in 07/2017 showing elevated triglycerides at 171.Takes lipitor at home 10 mg daily. -restart home atorvastatin   T2DM: Glucose ranging around 100-150's overnight.At home on Metformin XR 1000 mg daily.  -sSSI  -Monitor glucose   Diabetic peripheral neuropathy:Stable, in left leg mainly. Takes Lyrica 50 mg daily. -Cont home lyrica  Tobacco JYN:WGNFAOuse:Smokes 1PPD, 20 pack year history. Offered nicotine patch, pt declines at this time.  FEN/GI: full diet  PPx: Heparin  Disposition: stable for d/c today, f/u with GI and surgery outpatient   Subjective:  No acute events overnight.  No further abdominal pain, nausea, or vomiting.  Pain regular bowel movements.  Objective: Temp:  [98.7 F (37.1 C)-99.5 F (37.5 C)] 98.8 F (37.1 C) (08/24 0520) Pulse Rate:  [83-90] 83 (08/24 0520) Resp:  [18-19] 19 (08/24 0520) BP: (128-136)/(86-95) 128/86 (08/24 0520) SpO2:  [95 %-100 %] 100 % (08/24 0520) Physical  Exam: General: Alert, NAD HEENT: NCAT, MMM, oropharynx nonerythematous  Cardiac: RRR no m/g/r Lungs: Clear bilaterally, no increased WOB  Abdomen: soft, non-tender, non-distended, normoactive BS Msk: Moves all extremities spontaneously  Ext: Warm, dry, 2+ distal pulses, no edema   Laboratory: Recent Labs  Lab 01/31/18 0816 02/01/18 0544 02/02/18 0450  WBC 17.0* 12.8* 8.8  HGB 14.7 13.0 12.7*  HCT 45.7 40.4 39.3  PLT 281 198 195   Recent Labs  Lab 01/31/18 0816 02/01/18 0544 02/02/18 0450  NA 136 137 139  K 3.8 3.6 3.6  CL 104 105 107  CO2 21* 22 25  BUN 7 7 5*  CREATININE 1.08 1.17 1.01  CALCIUM 8.9 8.4* 8.8*  PROT 7.1 6.3*  --   BILITOT 0.8 1.4*  --   ALKPHOS 80 59  --   ALT 27 19  --   AST 18 10*  --   GLUCOSE 150* 111* 125*      [address]Andre Schlarb N, DO 02/02/2018, 7:01 AM PGY-1, [facility] FPTS Intern pager: [phone], text pages welcome

## 2018-02-02 NOTE — Discharge Summary (Signed)
[facility] Discharge Summary  Patient name: Andre Reynolds Medical record number: [id number] Date of birth: 1981/01/14 Age: 37 y.o. Gender: male Date of Admission: 01/31/2018  Date of Discharge: 02/02/2018 Admitting Physician: Martyn Malay, MD  Primary Care Provider: Bonnita Hollow, MD Consultants: Gen Surg  Indication for Hospitalization: Diverticulitis   Discharge Diagnoses/Problem List:  Diverticulitis with microperforation, improved   Hypertension Tobacco use Type 2 diabetes mellitus  Obesity   Disposition: Home   Discharge Condition: Stable   Discharge Exam:  General: Alert, NAD HEENT: NCAT, MMM, oropharynx nonerythematous  Cardiac: RRR no m/g/r Lungs: Clear bilaterally, no increased WOB  Abdomen: soft, non-tender, non-distended, normoactive BS Msk: Moves all extremities spontaneously  Ext: Warm, dry, 2+ distal pulses, no edema   Brief Hospital Course:  Mr. Andre Reynolds is a 37 year old male with a previous history significant for three previous episodes of diverticulitis, with one requiring hospitalization, and hypertension that presented with nausea, vomiting, and sharp LLQ abdominal pain. On arrival, he met sepsis criteria with tachycardia, fever of 101.7, and elevated WBC. CT abdomen showing descending diverticulitis with small volume extraluminal gas without abscess or pneumoperitoneum. Lactic acid 1.96. Vitals normalized with fluid resuscitation and initiation of ceftriaxone and flagyl in the ED. Surgery was consulted and recommended conservative treatment. Through his stay, his abdominal pain slowly subsided and tolerated a regular diet prior to discharge. He was transitioned to IV Zosyn on hospital day 1, and then switched to PO Augmentin at discharge for an additional two weeks until he is able to follow up with surgery. He was provided information on an appropriate high fiber diet to prevent further episodes. At discharge, he is afebrile,  hemodynamically stable, and without any abdominal pain, N/V.    His medical chronic conditions were appropriately managed during his stay.   Issues for Follow Up:  1. Ensure he follows with surgery, Dr. Hulen Skains, in two weeks. They also recommended he follow up with GI, last saw GI after his second episode.  2.  Please follow blood cultures results, 1/2 grew gram positive rods, other no growth at 2 days. Pharmacy indicated this is usually a contaminant with no further action required.   3. A1c 7.0, Pt reportedly taking metformin XR 1076m daily, however prescribed as should be taking 20043mdaily.    Significant Procedures: None   Significant Labs and Imaging:  Recent Labs  Lab 01/31/18 0816 02/01/18 0544 02/02/18 0450  WBC 17.0* 12.8* 8.8  HGB 14.7 13.0 12.7*  HCT 45.7 40.4 39.3  PLT 281 198 195   Recent Labs  Lab 01/31/18 0816 02/01/18 0544 02/02/18 0450  NA 136 137 139  K 3.8 3.6 3.6  CL 104 105 107  CO2 21* 22 25  GLUCOSE 150* 111* 125*  BUN 7 7 5*  CREATININE 1.08 1.17 1.01  CALCIUM 8.9 8.4* 8.8*  ALKPHOS 80 59  --   AST 18 10*  --   ALT 27 19  --   ALBUMIN 3.8 3.3*  --      Results/Tests Pending at Time of Discharge: Blood cultures  Discharge Medications:  Allergies as of 02/02/2018   No Known Allergies     Medication List    STOP taking these medications   HYDROcodone-acetaminophen 5-325 MG tablet Commonly known as:  NORCO/VICODIN   ibuprofen 600 MG tablet Commonly known as:  ADVIL,MOTRIN     TAKE these medications   ACCU-CHEK COMPACT CARE KIT Kit Use to check sugar three  times a day   acetaminophen 500 MG tablet Commonly known as:  TYLENOL Take 1 tablet (500 mg total) by mouth every 6 (six) hours as needed.   amLODipine 5 MG tablet Commonly known as:  NORVASC TAKE 1 TABLET BY MOUTH EVERY DAY   amoxicillin-clavulanate 875-125 MG tablet Commonly known as:  AUGMENTIN Take 1 tablet by mouth every 12 (twelve) hours for 14 days.   atorvastatin  10 MG tablet Commonly known as:  LIPITOR Take 1 tablet (10 mg total) by mouth daily.   [employer] VITAMIN B12 1000 MCG tablet Generic drug:  cyanocobalamin TAKE 1 TABLET BY MOUTH EVERY DAY What changed:  how much to take   freestyle lancets Use to check sugar three times a day   glucose blood test strip Use to check sugar three times a day   Lancing Device Misc Use to check sugar three times a day   LYRICA 50 MG capsule Generic drug:  pregabalin TAKE 1 CAPSULE (50 MG TOTAL) BY MOUTH 2 (TWO) TIMES DAILY. What changed:  See the new instructions.   metFORMIN 500 MG 24 hr tablet Commonly known as:  GLUCOPHAGE-XR TAKE 2 TABLETS BY MOUTH DAILY WITH BREAKFAST. THEN INCREASE TO 4 TABLETS AFTER 1 WEEK. What changed:  See the new instructions.       Discharge Instructions: Please refer to Patient Instructions section of EMR for full details.  Patient was counseled important signs and symptoms that should prompt return to medical care, changes in medications, dietary instructions, activity restrictions, and follow up appointments.   Follow-Up Appointments: Follow-up Information    Judeth Horn, MD. Schedule an appointment as soon as possible for a visit in 2 week(s).   Specialty:  General Surgery Contact information: [address] [place] [address] [phone]        Bonnita Hollow, MD. Schedule an appointment as soon as possible for a visit in 1 week(s).   Specialty:  Family Medicine Contact information: [address] [place] [address] [place], [place], DO 02/02/2018, 5:27 PM PGY-1, [place]

## 2018-02-02 NOTE — Progress Notes (Signed)
Subjective/Chief Complaint: No complaints   Objective: Vital signs in last 24 hours: Temp:  [98.7 F (37.1 C)-99.5 F (37.5 C)] 98.8 F (37.1 C) (08/24 0520) Pulse Rate:  [83-90] 83 (08/24 0520) Resp:  [18-19] 19 (08/24 0520) BP: (128-136)/(86-95) 128/86 (08/24 0520) SpO2:  [95 %-100 %] 100 % (08/24 0520) Last BM Date: 01/31/18  Intake/Output from previous day: 08/23 0701 - 08/24 0700 In: 1866 [P.O.:1866] Out: -  Intake/Output this shift: No intake/output data recorded.  General appearance: alert and cooperative Resp: clear to auscultation bilaterally Cardio: regular rate and rhythm GI: soft, nontender  Lab Results:  Recent Labs    02/01/18 0544 02/02/18 0450  WBC 12.8* 8.8  HGB 13.0 12.7*  HCT 40.4 39.3  PLT 198 195   BMET Recent Labs    02/01/18 0544 02/02/18 0450  NA 137 139  K 3.6 3.6  CL 105 107  CO2 22 25  GLUCOSE 111* 125*  BUN 7 5*  CREATININE 1.17 1.01  CALCIUM 8.4* 8.8*   PT/INR No results for input(s): LABPROT, INR in the last 72 hours. ABG No results for input(s): PHART, HCO3 in the last 72 hours.  Invalid input(s): PCO2, PO2  Studies/Results: Ct Abdomen Pelvis W Contrast  Result Date: 01/31/2018 CLINICAL DATA:  Abdominal pain with history of diverticulitis. EXAM: CT ABDOMEN AND PELVIS WITH CONTRAST TECHNIQUE: Multidetector CT imaging of the abdomen and pelvis was performed using the standard protocol following bolus administration of intravenous contrast. CONTRAST:  100 cc Isovue-300 intravenous COMPARISON:  10/02/2017 FINDINGS: Lower chest:  No contributory findings. Hepatobiliary: No focal liver abnormality.No evidence of biliary obstruction or stone. Pancreas: Unremarkable. Spleen: Unremarkable. Adrenals/Urinary Tract: Stable mild thickening of the left adrenal medial limb, suspect underlying adenoma. No hydronephrosis or stone. Unremarkable bladder. Stomach/Bowel: Focal distal descending low-density colonic wall thickening with  pericolonic inflammation and few bubbles of gas extraluminal posterior to a thickened diverticulum. No pneumoperitoneum or abscess. Diverticulosis is seen both on the ascending and left colon. Vascular/Lymphatic: No acute vascular abnormality. No mass or adenopathy. Reproductive:No pathologic findings. Other: No ascites or pneumoperitoneum. Musculoskeletal: Notable disc degeneration at L5-S1, likely accelerated by bilateral L5 pars defects. Disc height loss and bulging presumably impinges on the bilateral L5 foraminal nerves. IMPRESSION: Descending diverticulitis. Small volume extraluminal gas without abscess or pneumoperitoneum. Electronically Signed   By: Marnee SpringJonathon  Watts M.D.   On: 01/31/2018 10:45    Anti-infectives: Anti-infectives (From admission, onward)   Start     Dose/Rate Route Frequency Ordered Stop   02/02/18 1000  amoxicillin-clavulanate (AUGMENTIN) 875-125 MG per tablet 1 tablet     1 tablet Oral Every 12 hours 02/02/18 0959     01/31/18 1800  piperacillin-tazobactam (ZOSYN) IVPB 3.375 g  Status:  Discontinued     3.375 g 12.5 mL/hr over 240 Minutes Intravenous Every 8 hours 01/31/18 1144 02/02/18 0959   01/31/18 1130  piperacillin-tazobactam (ZOSYN) IVPB 3.375 g  Status:  Discontinued     3.375 g 100 mL/hr over 30 Minutes Intravenous  Once 01/31/18 1117 01/31/18 1119   01/31/18 0845  cefTRIAXone (ROCEPHIN) 2 g in sodium chloride 0.9 % 100 mL IVPB  Status:  Discontinued     2 g 200 mL/hr over 30 Minutes Intravenous Every 24 hours 01/31/18 0834 01/31/18 1144   01/31/18 0845  metroNIDAZOLE (FLAGYL) IVPB 500 mg  Status:  Discontinued     500 mg 100 mL/hr over 60 Minutes Intravenous Every 8 hours 01/31/18 0834 01/31/18 1144  Assessment/Plan: s/p * No surgery found * Advance diet  Switch to oral abx Ok for discharge from surgery standpoint F/U with Dr. Lindie Spruce in 2 weeks  LOS: 2 days    TOTH III,Lizbet Cirrincione S 02/02/2018

## 2018-02-03 LAB — CULTURE, BLOOD (ROUTINE X 2): SPECIAL REQUESTS: ADEQUATE

## 2018-02-05 LAB — CULTURE, BLOOD (ROUTINE X 2): CULTURE: NO GROWTH

## 2018-02-12 ENCOUNTER — Ambulatory Visit: Payer: Medicaid Other | Admitting: Family Medicine

## 2018-02-12 ENCOUNTER — Encounter: Payer: Self-pay | Admitting: Family Medicine

## 2018-02-12 ENCOUNTER — Other Ambulatory Visit: Payer: Self-pay

## 2018-02-12 VITALS — BP 126/72 | HR 91 | Temp 98.5°F | Ht 68.0 in | Wt 297.0 lb

## 2018-02-12 DIAGNOSIS — E119 Type 2 diabetes mellitus without complications: Secondary | ICD-10-CM | POA: Diagnosis not present

## 2018-02-12 DIAGNOSIS — K5730 Diverticulosis of large intestine without perforation or abscess without bleeding: Secondary | ICD-10-CM

## 2018-02-12 DIAGNOSIS — Z23 Encounter for immunization: Secondary | ICD-10-CM | POA: Insufficient documentation

## 2018-02-12 DIAGNOSIS — R198 Other specified symptoms and signs involving the digestive system and abdomen: Secondary | ICD-10-CM | POA: Insufficient documentation

## 2018-02-12 DIAGNOSIS — F17200 Nicotine dependence, unspecified, uncomplicated: Secondary | ICD-10-CM

## 2018-02-12 MED ORDER — POLYETHYLENE GLYCOL 3350 17 GM/SCOOP PO POWD: 17.0000 g | Freq: Two times a day (BID) | ORAL | 1 refills | Status: DC | PRN

## 2018-02-12 MED ORDER — BUPROPION HCL 75 MG PO TABS: 75.0000 mg | ORAL_TABLET | Freq: Two times a day (BID) | ORAL | 0 refills | Status: DC

## 2018-02-12 MED ORDER — METFORMIN HCL ER 500 MG PO TB24: 1000.0000 mg | ORAL_TABLET | Freq: Two times a day (BID) | ORAL | 2 refills | Status: DC

## 2018-02-12 NOTE — Assessment & Plan Note (Signed)
wellbutrin 38mh BID, declines nicotine replacement  Currently ~1pack/day

## 2018-02-12 NOTE — Patient Instructions (Signed)
It was a pleasure to see you today! Thank you for choosing [facility] for your primary care. Andre Reynolds was seen for hospital followup. Come back to the clinic if you have any routine concerns, and go to the emergency room if you have life threatening symptoms.   Today we prescribed the increased metformin dose (1000 in the morning and 1000 at night).  Dr. Janee Morn can recheck your A1C around December or January.  We also prescribed some miralax to help with your straining for bowel movements.  We sent in a referral to gastroenterology (GI).  Call us if you haven't heard from someone in 2 weeks.  We also ordered some wellbutrin to help with your efforts to stop smoking.  Good luck!Marland Kitchen  You can also call the quit line to try and get some free nicotine patches.   Please bring all your medications to every doctors visit   Sign up for My Chart to have easy access to your labs results, and communication with your Primary care physician.     Please check-out at the front desk before leaving the clinic.     Best,  Dr. Marthenia Rolling FAMILY MEDICINE RESIDENT - PGY2 02/12/2018 11:42 AM

## 2018-02-12 NOTE — Assessment & Plan Note (Signed)
BM daily but straining, no blood.  Recent diverticulitis  Rx miralax at patient request

## 2018-02-12 NOTE — Assessment & Plan Note (Signed)
Still with mild discomfort but improving.  Daily BM with blood and no febrile symptoms.  Has f/u with surgeon on 9/13 and accepts GI referral.  Ordering miralax as well.  We discussed corynbacterium result in 1/2 blood culture and that with his well status for ~2wks that this was almost definitely contaminate.  Shared decision making to not treat with anti-biotics.

## 2018-02-12 NOTE — Assessment & Plan Note (Signed)
Flu shot given

## 2018-02-12 NOTE — Progress Notes (Signed)
    Subjective:  Andre Reynolds is a 37 y.o. male who presents to the [facility] today with a chief complaint of hospital followup.   HPI: Hospital f/u for diverticulitis.  Still with mild discomfort but improving.  Daily BM with blood and no febrile symptoms.  Has f/u with surgeon on 9/13 and accepts GI referral.  Ordering miralax as well.  Straining for BM but no blood.   We discussed corynbacterium result in 1/2 blood culture and that with his well status for ~2wks.  Has not been taking metformin as prescribed.  We discussed this and the explanation he gave included the pharmacy losing/changing the doses.  He is amenable to whatever dose is proescribed.  Also discussed smoking.  He has been trying to cut down and is open to medicinal assistance.  He is currently at ~1pack/day.  Objective:  Physical Exam: BP 126/72   Pulse 91   Temp 98.5 F (36.9 C) (Oral)   Ht 5\' 8"  (1.727 m)   Wt 297 lb (134.7 kg)   SpO2 96%   BMI 45.16 kg/m   Gen: NAD, resting comfortably CV: RRR with no murmurs appreciated Pulm: NWOB, CTAB with no crackles, wheezes, or rhonchi GI: Some left sided tenderness, 6/10 to deep palpation, negligible otherwise. Soft, Nondistended. MSK: no edema, cyanosis, or clubbing noted Skin: warm, dry Neuro: grossly normal, moves all extremities Psych: Normal affect and thought content  No results found for this or any previous visit (from the past 72 hour(s)).   Assessment/Plan:  Tobacco use disorder wellbutrin 68mh BID, declines nicotine replacement  Currently ~1pack/day  Straining with stools BM daily but straining, no blood.  Recent diverticulitis  Rx miralax at patient request  Need for immunization against influenza Flu shot given  Type 2 diabetes mellitus (HCC) Was taking metformin 500 bID but was intended to be on 1000BID per recent discharge summary.  Discussed w/ patient and ordered at 1000 BID  Diverticulosis of colon Still with mild discomfort but  improving.  Daily BM with blood and no febrile symptoms.  Has f/u with surgeon on 9/13 and accepts GI referral.  Ordering miralax as well.  We discussed corynbacterium result in 1/2 blood culture and that with his well status for ~2wks that this was almost definitely contaminate.  Shared decision making to not treat with anti-biotics.   Marthenia Rolling, DO FAMILY MEDICINE RESIDENT - PGY2 02/12/2018 1:30 PM

## 2018-02-12 NOTE — Assessment & Plan Note (Signed)
Was taking metformin 500 bID but was intended to be on 1000BID per recent discharge summary.  Discussed w/ patient and ordered at 1000 BID

## 2018-02-19 ENCOUNTER — Encounter: Payer: Medicaid Other | Admitting: Neurology

## 2018-02-26 ENCOUNTER — Other Ambulatory Visit: Payer: Self-pay | Admitting: Family Medicine

## 2018-02-26 DIAGNOSIS — F17200 Nicotine dependence, unspecified, uncomplicated: Secondary | ICD-10-CM

## 2018-05-14 ENCOUNTER — Ambulatory Visit (INDEPENDENT_AMBULATORY_CARE_PROVIDER_SITE_OTHER): Payer: Medicaid Other | Admitting: Neurology

## 2018-05-14 DIAGNOSIS — M5417 Radiculopathy, lumbosacral region: Secondary | ICD-10-CM | POA: Diagnosis not present

## 2018-05-14 DIAGNOSIS — R202 Paresthesia of skin: Secondary | ICD-10-CM | POA: Diagnosis not present

## 2018-05-14 DIAGNOSIS — M79605 Pain in left leg: Secondary | ICD-10-CM

## 2018-05-14 NOTE — Procedures (Signed)
[facility]  [address]  [place], [place] [address] Tel: [phone]) [phone] Fax:  [phone]) [phone] Test Date:  05/14/2018  Patient: Andre HaringQuentin Reynolds DOB: [birth year] Physician: Nita Sickleonika , DO  Sex: Male Height: 5\' 8"  Ref Phys: Nita Sickleonika , DO  ID#: [id number] Temp: 35.0C Technician:    Patient Complaints: This is a 37 year old man with left leg pain and paresthesias.    NCV & EMG Findings: Extensive electrodiagnostic testing of the left lower extremity and additional studies of the right shows:  1. Bilateral sural and superficial peroneal sensory responses are within normal limits. 2. Bilateral peroneal and right tibial motor responses are within normal limits.  Left tibial motor response shows reduced amplitude (7.3 mV).   3. Bilateral tibial H-reflex studies are within normal limits.   4. Sparse chronic motor axonal loss changes are seen affecting the S1 myotomes on the left, without accompanied active denervation.    Impression: 1. Chronic S1 radiculopathy affecting the left lower extremity, very mild in degree electrically. 2. There is no evidence of a sensorimotor polyneuropathy affecting the lower extremities.   ___________________________ Nita Sickleonika , DO    Nerve Conduction Studies Anti Sensory Summary Table   Site NR Peak (ms) Norm Peak (ms) P-T Amp (V) Norm P-T Amp  Left Sup Peroneal Anti Sensory (Ant Lat Mall)  35C  12 cm    2.8 <4.5 6.4 >5  Right Sup Peroneal Anti Sensory (Ant Lat Mall)  35C  12 cm    2.4 <4.5 6.3 >5  Left Sural Anti Sensory (Lat Mall)  35C  Calf    3.7 <4.5 19.8 >5  Right Sural Anti Sensory (Lat Mall)  35C  Calf    3.7 <4.5 22.1 >5   Motor Summary Table   Site NR Onset (ms) Norm Onset (ms) O-P Amp (mV) Norm O-P Amp Site1 Site2 Delta-0 (ms) Dist (cm) Vel (m/s) Norm Vel (m/s)  Left Peroneal Motor (Ext Dig Brev)  35C  Ankle    4.7 <5.5 3.7 >3 B Fib Ankle 8.6 40.0 47 >40  B Fib    13.3  3.6  Poplt B Fib 1.5 9.0 60 >40    Poplt    14.8  3.6         Right Peroneal Motor (Ext Dig Brev)  35C  Ankle    3.8 <5.5 3.7 >3 B Fib Ankle 8.9 41.0 46 >40  B Fib    12.7  3.5  Poplt B Fib 1.2 8.0 67 >40  Poplt    13.9  3.5         Left Tibial Motor (Abd Hall Brev)  35C  Ankle    5.1 <6.0 7.3 >8 Knee Ankle 9.0 44.0 49 >40  Knee    14.1  5.9         Right Tibial Motor (Abd Hall Brev)  35C  Ankle    4.5 <6.0 9.9 >8 Knee Ankle 10.0 40.0 40 >40  Knee    14.5  7.8          H Reflex Studies   NR H-Lat (ms) Lat Norm (ms) L-R H-Lat (ms)  Left Tibial (Gastroc)  35C     32.38 <35 0.00  Right Tibial (Gastroc)  35C     32.38 <35 0.00   EMG   Side Muscle Ins Act Fibs Psw Fasc Number Recrt Dur Dur. Amp Amp. Poly Poly. Comment  Left AntTibialis Nml Nml Nml Nml Nml Nml Nml Nml Nml Nml Nml Nml  N/A  Left Gastroc Nml Nml Nml Nml 1- Rapid Few 1+ Few 1+ Nml Nml N/A  Left Flex Dig Long Nml Nml Nml Nml Nml Nml Nml Nml Nml Nml Nml Nml N/A  Left RectFemoris Nml Nml Nml Nml Nml Nml Nml Nml Nml Nml Nml Nml N/A  Left GluteusMed Nml Nml Nml Nml Nml Nml Nml Nml Nml Nml Nml Nml N/A  Left BicepsFemS Nml Nml Nml Nml 1- Rapid Few 1+ Few 1+ Nml Nml N/A      Waveforms:

## 2018-05-14 NOTE — Progress Notes (Signed)
Follow-up Visit   Date: 05/14/18    Andre Reynolds MRN: [id number] DOB: [birth year]   Interim History: Andre Reynolds is a 37 y.o. right-handed African American male with asthma, diabetes mellitus, hypertension, and hyperlipidemia  returning to the clinic for follow-up of left leg pain.    History of present illness: His left leg pain has been chronic and dates back to 1999 when he was playing basketball and reports twisting his leg the wrong way.  He developed left achy thigh pain and numbness.  Since this time, pain has always been present and gradually become more intense especially over the past two years.  He has burning and throbbing sensation from the thigh all the way into his lower leg and foot.  He had tingling/numbness over the sole of the left foot.  He has tried Lyrica '50mg'$  twice daily which does not provide relief.  No benefit with gabapentin.  He does not have weakness, falls, or imbalance.  He walks unassisted.  He endorses low back pain.  He has not done any physical therapy.  He had been told he has meralgia paresthetica in the past.  He used to weigh 385lb and has lost > 80lb since being diagnosed with diabetes last year.  UPDATE 05/14/2018:  He is here for electrodiagnostic testing of the legs due to ongoing left leg pain.   Medications:  Current Outpatient Medications on File Prior to Visit  Medication Sig Dispense Refill  . acetaminophen (TYLENOL) 500 MG tablet Take 1 tablet (500 mg total) by mouth every 6 (six) hours as needed. 30 tablet 0  . amLODipine (NORVASC) 5 MG tablet TAKE 1 TABLET BY MOUTH EVERY DAY (Patient taking differently: Take 5 mg by mouth daily. ) 30 tablet 2  . atorvastatin (LIPITOR) 10 MG tablet Take 1 tablet (10 mg total) by mouth daily. 90 tablet 3  . Blood Glucose Monitoring Suppl (ACCU-CHEK COMPACT CARE KIT) KIT Use to check sugar three times a day 1 each 0  . buPROPion (WELLBUTRIN) 75 MG tablet TAKE 1 TABLET BY MOUTH TWICE A DAY 180 tablet  3  . [employer] VITAMIN B12 1000 MCG tablet TAKE 1 TABLET BY MOUTH EVERY DAY (Patient taking differently: Take 1,000 mcg by mouth daily. ) 30 tablet 2  . glucose blood (ACCU-CHEK COMPACT PLUS) test strip Use to check sugar three times a day 100 each 12  . Lancet Devices (LANCING DEVICE) MISC Use to check sugar three times a day 1 each 0  . Lancets (FREESTYLE) lancets Use to check sugar three times a day 100 each 12  . LYRICA 50 MG capsule TAKE 1 CAPSULE (50 MG TOTAL) BY MOUTH 2 (TWO) TIMES DAILY. (Patient taking differently: Take 50 mg by mouth at bedtime. ) 60 capsule 0  . metFORMIN (GLUCOPHAGE-XR) 500 MG 24 hr tablet Take 2 tablets (1,000 mg total) by mouth 2 (two) times daily. 120 tablet 2  . polyethylene glycol powder (GLYCOLAX/MIRALAX) powder Take 17 g by mouth 2 (two) times daily as needed. 3350 g 1   No current facility-administered medications on file prior to visit.     Allergies: No Known Allergies  Review of Systems:  CONSTITUTIONAL: No fevers, chills, night sweats, +weight loss.  EYES: No visual changes or eye pain ENT: No hearing changes.  No history of nose bleeds.   RESPIRATORY: No cough, wheezing and shortness of breath.   CARDIOVASCULAR: Negative for chest pain, and palpitations.   GI: Negative for abdominal discomfort, blood  in stools or black stools.  No recent change in bowel habits.   GU:  No history of incontinence.   MUSCLOSKELETAL: +history of joint pain or swelling.  No myalgias.   SKIN: Negative for lesions, rash, and itching.   ENDOCRINE: Negative for cold or heat intolerance, polydipsia or goiter.   PSYCH:  No depression or anxiety symptoms.   NEURO: As Above.    Neurological Exam: Deferred  Data: NCS/EMG of the legs 1. Chronic S1 radiculopathy affecting the left lower extremity, very mild in degree electrically. 2. There is no evidence of a sensorimotor polyneuropathy affecting the lower extremities.  Lab Results  Component Value Date   HGBA1C 7.0 (H)  02/01/2018    IMPRESSION/PLAN: Left leg pain, possibly due to S1 radiculopathy.  Results of NCS/EMG discussed. No signs of neuropathy on his NCS which is reassuring especially given history of diabetes.  I will start him physical therapy program for low back stretching and left leg pain.  If no improvement, will proceed with MRI lumbar spine.   Left meralgia paresthetica, stable.  He was praised for his weight loss and encouraged to keep up with life style modification.  Return to clinic in 4 months   Thank you for allowing me to participate in patient's care.  If I can answer any additional questions, I would be pleased to do so.    Sincerely,    Varnell Orvis K. Posey Pronto, DO

## 2018-05-15 ENCOUNTER — Other Ambulatory Visit: Payer: Self-pay | Admitting: *Deleted

## 2018-05-15 DIAGNOSIS — R202 Paresthesia of skin: Secondary | ICD-10-CM

## 2018-05-15 DIAGNOSIS — M79605 Pain in left leg: Secondary | ICD-10-CM

## 2018-05-15 DIAGNOSIS — M5417 Radiculopathy, lumbosacral region: Secondary | ICD-10-CM

## 2018-05-20 ENCOUNTER — Telehealth: Payer: Self-pay | Admitting: Neurology

## 2018-05-20 NOTE — Telephone Encounter (Signed)
Patient is having sever numbness and pain in both legs since having the EMG would like to know what they can do to help with this problem please call

## 2018-05-21 NOTE — Telephone Encounter (Signed)
Patient informed that the EMG does not cause injury.  Instructed him to start the PT which he says that he starts on the 17th.  If no better after a few weeks, let us know.

## 2018-05-22 ENCOUNTER — Telehealth: Payer: Self-pay | Admitting: *Deleted

## 2018-05-22 NOTE — Telephone Encounter (Signed)
Pts wife called.  She heard about the metformin recall and wants to know what they need to do. Xue Low, Maryjo RochesterJessica Dawn, CMA

## 2018-05-23 DIAGNOSIS — H52223 Regular astigmatism, bilateral: Secondary | ICD-10-CM | POA: Diagnosis not present

## 2018-05-23 DIAGNOSIS — H5203 Hypermetropia, bilateral: Secondary | ICD-10-CM | POA: Diagnosis not present

## 2018-05-23 DIAGNOSIS — H11133 Conjunctival pigmentations, bilateral: Secondary | ICD-10-CM | POA: Diagnosis not present

## 2018-05-23 NOTE — Telephone Encounter (Signed)
Please call patent and inform her that as far as I am aware, metformin has only been recalled in [place] and not the [place]. Also tell her thank you for bringing it to my attention. I will continue to look out for this.

## 2018-05-24 DIAGNOSIS — H5213 Myopia, bilateral: Secondary | ICD-10-CM | POA: Diagnosis not present

## 2018-05-27 NOTE — Telephone Encounter (Signed)
Wife informed and will pass this along to patient. Sumi Lye,CMA

## 2018-05-28 ENCOUNTER — Ambulatory Visit: Payer: Medicaid Other | Admitting: Physical Therapy

## 2018-06-04 ENCOUNTER — Ambulatory Visit: Payer: Medicaid Other | Attending: Neurology | Admitting: Physical Therapy

## 2018-06-04 ENCOUNTER — Encounter: Payer: Self-pay | Admitting: Physical Therapy

## 2018-06-04 ENCOUNTER — Other Ambulatory Visit: Payer: Self-pay

## 2018-06-04 DIAGNOSIS — M5416 Radiculopathy, lumbar region: Secondary | ICD-10-CM | POA: Diagnosis present

## 2018-06-04 DIAGNOSIS — R208 Other disturbances of skin sensation: Secondary | ICD-10-CM | POA: Diagnosis present

## 2018-06-04 DIAGNOSIS — M6281 Muscle weakness (generalized): Secondary | ICD-10-CM | POA: Diagnosis present

## 2018-06-04 DIAGNOSIS — M5442 Lumbago with sciatica, left side: Secondary | ICD-10-CM | POA: Diagnosis present

## 2018-06-04 NOTE — Therapy (Signed)
[facility] [address] [place], [place], [address] Phone: [phone]   Fax:  [phone]  Physical Therapy Evaluation  Patient Details  Name: Andre Reynolds MRN: [id number] Date of Birth: 12-Sep-1980 Referring Provider (PT): Dr. Nita Sickle   Encounter Date: 06/04/2018  PT End of Session - 06/04/18 0840    Visit Number  1    Number of Visits  4    PT Start Time  0815    PT Stop Time  0845    PT Time Calculation (min)  30 min    Activity Tolerance  Patient tolerated treatment well    Behavior During Therapy  [facility] for tasks assessed/performed       Past Medical History:  Diagnosis Date  . Asthma   . Diabetes mellitus (HCC)   . DIVERTICULITIS, HX OF 08/03/2009   Qualifier: Diagnosis of  By: Gilford Rile MD, Luisa Hart    . Hypertension   . Pneumonia     Past Surgical History:  Procedure Laterality Date  . WISDOM TOOTH EXTRACTION    . WRIST SURGERY      There were no vitals filed for this visit.   Subjective Assessment - 06/04/18 0816    Subjective  Patient reports chronic back and LE pain.  Reports pain increasing to severe about 4-5 yrs ago.  He thought it was a leg injury.  The older I get the worse it gets.  He has difficulty standing >30 min, walking in the community.  He has some diff squatting and bending.  He reports leg sensory sx, occ weakness.      Pertinent History  diabetes     Limitations  Lifting;Standing;Walking;House hold activities   sleeping ,laying on L side    How long can you stand comfortably?  30 min     Diagnostic tests  none, may have a MRI post PT     Patient Stated Goals  Patient would like to be able to have no pain.     Currently in Pain?  Yes    Pain Score  5     Pain Location  Back    Pain Orientation  Left    Pain Descriptors / Indicators  Aching    Pain Type  Chronic pain    Pain Radiating Towards  ant lateral LLE and sometimes it is his whole leg     Pain Onset  More than a month  ago    Pain Frequency  Intermittent    Aggravating Factors   actiity     Pain Relieving Factors  rest, pain meds, extending leg and massaging it     Effect of Pain on Daily Activities  unable to work, limits interaction with kids (6 kids)          [facility] PT Assessment - 06/04/18 0001      Assessment   Medical Diagnosis  lumbosacral radiculopathy    Referring Provider (PT)  Dr. 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    Leisure  sedentary, watch TV, video games       Cognition   Overall Cognitive Status  Within Functional Limits for tasks assessed      Observation/Other Assessments   Focus on Therapeutic Outcomes (FOTO)   NT due to MCD       Sensation   Light Touch  Impaired by gross assessment    Additional Comments  hypersensitive along L thigh and lower leg (lateral)       Coordination   Gross Motor Movements are Fluid and Coordinated  Not tested      Posture/Postural Control   Posture/Postural Control  Postural limitations    Postural Limitations  Rounded Shoulders;Forward head    Posture Comments  difficult to assess due to body habitus , obesity       AROM   AROM Assessment Site  --   increased time to attain position, pain with all    Lumbar Flexion  WFL with pain in back     Lumbar Extension  50% pain in back     Lumbar - Right Side Bend  WFL    Lumbar - Left Side Bend  WFL    Lumbar - Right Rotation  WFL    Lumbar - Left Rotation  WFL       PROM   Overall PROM Comments  hip PROM limited in extension       Strength   Right  Hip Flexion  5/5    Right Hip ABduction  5/5    Left Hip Flexion  4/5    Left Hip ABduction  3+/5    Right Knee Flexion  5/5    Right Knee Extension  5/5    Left Knee Flexion  4-/5    Left Knee Extension  4/5      Flexibility   Hamstrings  tight     Quadriceps  tight       Palpation   Palpation comment  tender and painful along L lateral hip and thigh                 Objective measurements completed on examination: See above findings.              PT Education - 06/04/18 0853    Education Details  PT/POC, HEP    Person(s) Educated  Patient    Methods  Explanation;Demonstration;Handout    Comprehension  Verbalized understanding;Returned demonstration       PT Short Term Goals - 06/04/18 0854      PT SHORT TERM GOAL #1   Title  Pt will be I with HEP for basic flexibility and core, LE strength     Baseline  unknown, given on eval     Time  3    Period  Weeks    Status  New    Target Date  06/25/18      PT SHORT TERM GOAL #2   Title  Pt will be able to centralize pain with extension exercises consistently    Baseline  pain regularly extends into L hip     Time  3    Period  Weeks    Status  New    Target Date  06/25/18      PT SHORT TERM GOAL #3   Title  Pt will  identify 3 strategies for pain control at home.     Baseline  just sits and rests to relieve pain     Time  3    Period  Weeks    Status  New    Target Date  06/25/18                Plan - 06/04/18 1302    Clinical Impression Statement  Patient presents for low complexity eval of chronic lumbosacral pain with radicular sx in LLE.  He presents with limitation in all apsect of mobilty.  He has had pain for >20 yrs but now the pain has become severe.  He had a NCV test and it was fairly normal.  Prone posiitoning relieved pain in LEs.  He may improve minimally but due to the length of time he has been dealing with this pain, progress  may be limited.     Clinical Presentation   Stable    Clinical Decision Making  Low    Rehab Potential  Good    PT Frequency  2x / week    PT Duration  6 weeks   after first 3 MCD visits    PT Treatment/Interventions  ADLs/Self Care Home Management;Cryotherapy;Traction;Gait training;Therapeutic exercise;Patient/family education;Manual techniques;Passive range of motion;Electrical Stimulation;Ultrasound;Functional mobility training;Therapeutic activities;Neuromuscular re-education;Taping;Moist Heat    PT Next Visit Plan  strengthening, core/hip, Nustep , prone , modalities     PT Home Exercise Plan  POE, hamstring stretch, LTR, core     Consulted and Agree with Plan of Care  Patient       Patient will benefit from skilled therapeutic intervention in order to improve the following deficits and impairments:  Pain, Postural dysfunction, Impaired UE functional use, Impaired flexibility, Increased fascial restricitons, Decreased strength, Decreased activity tolerance, Obesity, Difficulty walking, Decreased mobility, Decreased endurance, Decreased balance  Visit Diagnosis: Other disturbances of skin sensation  Radiculopathy, lumbar region  Left-sided low back pain with left-sided sciatica, unspecified chronicity  Muscle weakness (generalized)     Problem List Patient Active Problem List   Diagnosis Date Noted  . Need for immunization against influenza 02/12/2018  . Straining with stools 02/12/2018  . Diverticulitis   . Sepsis (HCC)   . Microscopic hematuria 07/26/2017  . Type 2 diabetes mellitus (HCC) 07/13/2017  . Essential hypertension 07/04/2017  . Severe obesity (BMI >= 40) (HCC) 02/19/2017  . Diverticulosis of colon 02/19/2017  . Tobacco use disorder 02/19/2017  . Meralgia paresthetica, left 06/07/2016    Matin Mattioli 06/04/2018, 1:08 PM  [facility] [address] [place], [place], [address] Phone: [phone]   Fax:  [phone]  Name: Vicki MalletQuentin T Vonstein MRN:  [id number] Date of Birth: 08/14/1980   Karie MainlandJennifer Clovis Warwick, PT 06/04/18 1:08 PM Phone: [phone] Fax: [phone]

## 2018-06-04 NOTE — Patient Instructions (Signed)
Step 1  Step 2  Supine Lower Trunk Rotation reps: 10  sets: 2  hold: 10  daily: 3  weekly: 7 Setup  Begin lying on your back with your knees bent and feet resting on the floor. Movement  Keeping your back flat, slowly rotate your knees down towards the floor until you feel a stretch in your trunk and hold. Tip  Make sure that your back and shoulders stay in contact with the floor. Step 1  Step 2  Seated Hamstring Stretch reps: 3  sets: 1  hold: 30  daily: 3  weekly: 7 Setup  Begin sitting upright with one leg straight forward and your heel resting on the ground. Movement  Bend your trunk forward, hinging at your hips until you feel a stretch in the back of your leg. Hold this position.  Tip  Make sure to keep your knee straight during the stretch and do not let your back arch or slump. Step 1  Step 2  Prone Press Up on Elbows reps: 10  sets: 2  hold: 30  daily: 3  weekly: 7 Setup  Begin lying on your stomach, resting on your elbows low to the ground. Movement  Push up on your elbows, bending your back upward. Tip  Make sure to keep your hips in contact with the floor and maintain a gentle chin tuck throughout the exercise. Step 1  Seated Transversus Abdominis Bracing reps: 10  sets: 1  hold: 10  daily: 3  weekly: 7 Setup  Begin sitting in an upright position with your hands on your lower abdominals. Movement  Slowly draw your navel in toward your spine, bracing your deep abdominal muscles. Hold, then relax and repeat. Tip  Make sure to sit tall throughout the exercise. Avoid bending your trunk forward and do not hold your breath.

## 2018-06-07 DIAGNOSIS — H52223 Regular astigmatism, bilateral: Secondary | ICD-10-CM | POA: Diagnosis not present

## 2018-06-07 DIAGNOSIS — H5203 Hypermetropia, bilateral: Secondary | ICD-10-CM | POA: Diagnosis not present

## 2018-06-18 ENCOUNTER — Encounter: Payer: Self-pay | Admitting: Physical Therapy

## 2018-06-18 ENCOUNTER — Ambulatory Visit: Payer: Medicaid Other | Attending: Neurology | Admitting: Physical Therapy

## 2018-06-18 DIAGNOSIS — M5416 Radiculopathy, lumbar region: Secondary | ICD-10-CM | POA: Insufficient documentation

## 2018-06-18 DIAGNOSIS — R208 Other disturbances of skin sensation: Secondary | ICD-10-CM | POA: Diagnosis not present

## 2018-06-18 DIAGNOSIS — M5442 Lumbago with sciatica, left side: Secondary | ICD-10-CM | POA: Diagnosis not present

## 2018-06-18 DIAGNOSIS — M6281 Muscle weakness (generalized): Secondary | ICD-10-CM | POA: Insufficient documentation

## 2018-06-18 NOTE — Therapy (Addendum)
[place], [place], [address] Phone: [phone]   Fax:  [phone]  Physical Therapy Treatment/Discharge   Patient Details  Name: Andre Reynolds MRN: [id number] Date of Birth: 05-19-1981 Referring Provider (PT): Dr. Narda Amber   Encounter Date: 06/18/2018  PT End of Session - 06/18/18 1537    Visit Number  2    Number of Visits  4    Date for PT Re-Evaluation  08/09/18    Authorization Type  Medicaid    Authorization Time Period  06/14/18-07/04/18    Authorization - Visit Number  1    Authorization - Number of Visits  3    PT Start Time  1062    PT Stop Time  1619    PT Time Calculation (min)  40 min       Past Medical History:  Diagnosis Date  . Asthma   . Diabetes mellitus ([place])   . DIVERTICULITIS, HX OF 08/03/2009   Qualifier: Diagnosis of  By: Vanessa Kick MD, Saralyn Pilar    . Hypertension   . Pneumonia     Past Surgical History:  Procedure Laterality Date  . WISDOM TOOTH EXTRACTION    . WRIST SURGERY      There were no vitals filed for this visit.  Subjective Assessment - 06/18/18 1541    Subjective  Pt. reports doing HEP and this has helped some with pain. Had left leg pain yesterday but pain local to back today. He reports decreased sleep disturbance.    Currently in Pain?  Yes    Pain Score  3     Pain Location  Back    Pain Orientation  Left;Lower    Pain Descriptors / Indicators  Aching    Pain Type  Chronic pain    Pain Onset  More than a month ago    Pain Frequency  Intermittent    Aggravating Factors   activity    Pain Relieving Factors  rest, pain medication, extending leg, massage    Effect of Pain on Daily Activities  unable to work, limits interactions with kids (6 kids)                       [facility] Adult PT Treatment/Exercise - 06/18/18 0001      Exercises   Exercises  Lumbar;Knee/Hip      Lumbar Exercises: Stretches   Lower Trunk Rotation  --   x 10  ea. way bilat.   Prone on Elbows Stretch  --   x 2 min   Press Ups  15 reps    Press Ups Limitations  prone press ups-started with elbows but then able to progress to arms straight      Lumbar Exercises: Aerobic   Nustep  L5 x 5 min UE/LE      Lumbar Exercises: Seated   Other Seated Lumbar Exercises  seated abd. bracing x 15 reps      Lumbar Exercises: Supine   Pelvic Tilt  15 reps    Clam  15 reps    Clam Limitations  Green Theraband    Dead Bug  15 reps    Dead Bug Limitations  LE only    Bridge  15 reps    Other Supine Lumbar Exercises  hip add. isometric with ball with abd. bracing x 15 reps      Lumbar Exercises: Prone   Single Arm Raise  Right;Left;10 reps   alt.  unilat.   Straight Leg Raise  10 reps    Straight Leg Raises Limitations  prone hip ext SLR    Other Prone Lumbar Exercises  see stretches for additional prone exercises      Manual Therapy   Manual Therapy  Joint mobilization    Joint Mobilization  Long axis distraction left hip grade I-III             PT Education - 06/18/18 1619    Education Details  HEP review, exercises, spine/nerve anatomy, strategies for pain control at home per therapy goals    Person(s) Educated  Patient    Methods  Explanation;Demonstration;Verbal cues;Tactile cues    Comprehension  Verbal cues required;Returned demonstration;Verbalized understanding;Tactile cues required       PT Short Term Goals - 06/18/18 1544      PT SHORT TERM GOAL #1   Title  Pt will be I with HEP for basic flexibility and core, LE strength     Baseline  met for HEP from eval    Time  3    Period  Weeks    Status  Achieved      PT SHORT TERM GOAL #2   Title  Pt will be able to centralize pain with extension exercises consistently    Baseline  helps at times but not yet consistent    Time  3    Period  Weeks    Status  On-going      PT SHORT TERM GOAL #3   Title  Pt will identify 3 strategies for pain control at home.     Baseline   unable-reviewed today    Time  3    Period  Weeks    Status  On-going               Plan - 06/18/18 1621    Clinical Impression Statement  Mild improvements from baseline status with decreased pain intensity and improvements such as decreased sleep disturbance. Continued previous exercises and progressed extension ROM, core stabilization. Some fatigue and notable core/hip weakness but able to tolerate without signifcant pain exacerbation. Expect gradual/slow progress overall given chronicity of symptoms.    PT Frequency  2x / week    PT Duration  6 weeks    PT Treatment/Interventions  ADLs/Self Care Home Management;Cryotherapy;Traction;Gait training;Therapeutic exercise;Patient/family education;Manual techniques;Passive range of motion;Electrical Stimulation;Ultrasound;Functional mobility training;Therapeutic activities;Neuromuscular re-education;Taping;Moist Heat    PT Next Visit Plan  continue strengthening, core/hip, Nustep , prone/extension bias exercises , modalities (held today-hydrocollator out of order)    PT Home Exercise Plan  POE, hamstring stretch, LTR, core     Consulted and Agree with Plan of Care  Patient       Patient will benefit from skilled therapeutic intervention in order to improve the following deficits and impairments:  Pain, Postural dysfunction, Impaired UE functional use, Impaired flexibility, Increased fascial restricitons, Decreased strength, Decreased activity tolerance, Obesity, Difficulty walking, Decreased mobility, Decreased endurance, Decreased balance  Visit Diagnosis: Other disturbances of skin sensation  Radiculopathy, lumbar region  Left-sided low back pain with left-sided sciatica, unspecified chronicity  Muscle weakness (generalized)     Problem List Patient Active Problem List   Diagnosis Date Noted  . Need for immunization against influenza 02/12/2018  . Straining with stools 02/12/2018  . Diverticulitis   . Sepsis ([place])   .  Microscopic hematuria 07/26/2017  . Type 2 diabetes mellitus ([place]) 07/13/2017  . Essential hypertension 07/04/2017  . Severe obesity (BMI >=  40) ([place]) 02/19/2017  . Diverticulosis of colon 02/19/2017  . Tobacco use disorder 02/19/2017  . Meralgia paresthetica, left 06/07/2016    Beaulah Dinning, PT, DPT 06/18/18 4:26 PM  [facility] [address] [place], [place], [address] Phone: [phone]   Fax:  [phone]  Name: Andre Reynolds MRN: [id number] Date of Birth: Oct 15, 1980  PHYSICAL THERAPY DISCHARGE SUMMARY  Visits from Start of Care: 2  Current functional level related to goals / functional outcomes: See above, unknown    Remaining deficits: See above for most recent info    Education / Equipment: HEP, posture, pain science  Plan: Patient agrees to discharge.  Patient goals were not met. Patient is being discharged due to not returning since the last visit.  ?????    Raeford Razor, PT 08/14/18 2:20 PM Phone: [phone] Fax: [phone]

## 2018-06-26 ENCOUNTER — Encounter: Payer: Medicaid Other | Admitting: Physical Therapy

## 2018-06-26 ENCOUNTER — Ambulatory Visit: Payer: Medicaid Other | Admitting: Physical Therapy

## 2018-07-03 ENCOUNTER — Ambulatory Visit: Payer: Medicaid Other | Admitting: Physical Therapy

## 2018-07-04 ENCOUNTER — Ambulatory Visit: Payer: Medicaid Other

## 2018-07-04 ENCOUNTER — Ambulatory Visit: Payer: Medicaid Other | Admitting: Physical Therapy

## 2018-07-15 ENCOUNTER — Ambulatory Visit: Payer: Medicaid Other | Attending: Neurology | Admitting: Physical Therapy

## 2018-07-15 ENCOUNTER — Telehealth: Payer: Self-pay | Admitting: Physical Therapy

## 2018-07-15 ENCOUNTER — Encounter: Payer: Self-pay | Admitting: Physical Therapy

## 2018-07-15 NOTE — Telephone Encounter (Signed)
Called patient regarding his missed appointment today and reminded him of the attendance policy.  Will discharge if no call within 2-3 days.  Karie Mainland, PT 07/15/18 12:25 PM Phone: [phone] Fax: [phone]

## 2018-08-13 ENCOUNTER — Other Ambulatory Visit: Payer: Self-pay | Admitting: *Deleted

## 2018-08-13 ENCOUNTER — Telehealth: Payer: Self-pay | Admitting: Neurology

## 2018-08-13 DIAGNOSIS — R202 Paresthesia of skin: Secondary | ICD-10-CM

## 2018-08-13 DIAGNOSIS — M5417 Radiculopathy, lumbosacral region: Secondary | ICD-10-CM

## 2018-08-13 DIAGNOSIS — M79605 Pain in left leg: Secondary | ICD-10-CM

## 2018-08-13 NOTE — Telephone Encounter (Signed)
Patient's wife called and she is needing a new referral for him to re start therapy. She said it has been 30 days. Thanks

## 2018-08-13 NOTE — Telephone Encounter (Signed)
New order placed for PT.

## 2018-08-13 NOTE — Telephone Encounter (Signed)
Ok to send him again for PT?

## 2018-08-13 NOTE — Telephone Encounter (Signed)
OK to resend referral for PT.

## 2018-08-28 ENCOUNTER — Telehealth: Payer: Self-pay

## 2018-08-28 NOTE — Telephone Encounter (Signed)
Pts wife called nurse line stating the patient has been experiencing NVD for the last few days. Pts wife states, "its his metformin causing these issues." Pt has been on metformin for sometime now without NVD before. Pts wife denies fever or chills. Informed her to keep him hydrated and I will forward to PCP for advisement.   Call back for her [phone].

## 2018-08-28 NOTE — Telephone Encounter (Signed)
Called patient. He said that he took metformin after missing for a few days then got N/Vx1. He has been on metformin for 6 mo+ at this dose, and even longer on a lower dose. Patient has not had this issue before. He is able to tolerate PO. And Overall feels fine. Advised patient to take metformin at half his current dose for a few days, if no S/S, he can increase back to full dose. If no issues, continue at higher dose, if he dose have issues at higher dose, he is to remain at half dose and call the clinic back for further guidance.

## 2018-09-04 ENCOUNTER — Encounter: Payer: Self-pay | Admitting: *Deleted

## 2018-09-16 ENCOUNTER — Ambulatory Visit: Payer: Medicaid Other | Admitting: Neurology

## 2018-09-18 ENCOUNTER — Telehealth (INDEPENDENT_AMBULATORY_CARE_PROVIDER_SITE_OTHER): Payer: Medicaid Other | Admitting: Neurology

## 2018-09-18 ENCOUNTER — Encounter: Payer: Self-pay | Admitting: *Deleted

## 2018-09-18 ENCOUNTER — Other Ambulatory Visit: Payer: Self-pay

## 2018-09-18 DIAGNOSIS — M5417 Radiculopathy, lumbosacral region: Secondary | ICD-10-CM | POA: Diagnosis not present

## 2018-09-18 DIAGNOSIS — M79604 Pain in right leg: Secondary | ICD-10-CM

## 2018-09-18 DIAGNOSIS — G5712 Meralgia paresthetica, left lower limb: Secondary | ICD-10-CM

## 2018-09-18 DIAGNOSIS — M79605 Pain in left leg: Secondary | ICD-10-CM

## 2018-09-18 NOTE — Progress Notes (Signed)
Virtual Visit via Video Note The purpose of this virtual visit is to provide medical care while limiting exposure to the novel coronavirus.    Consent was obtained for video visit:  Yes.   Answered questions that patient had about telehealth interaction:  Yes.   I discussed the limitations, risks, security and privacy concerns of performing an evaluation and management service by telemedicine. I also discussed with the patient that there may be a patient responsible charge related to this service. The patient expressed understanding and agreed to proceed.  Pt location: Home Physician Location: office Name of referring provider:  Bonnita Hollow, MD I connected with Andre Reynolds at patients initiation/request on 09/18/2018 at 11:00 AM EDT by video enabled telemedicine application and verified that I am speaking with the correct person using two identifiers. Pt MRN:  [id number] Pt DOB:  01/19/1981 Video Participants:  Andre Reynolds   History of Present Illness: This is a 38 y.o. male returning for follow-up of new right leg radiculopathy and left meralgia paresthetica. He underwent EMG in December 2019 which showed very mild chronic S1 radiculopathy.  He was referred to physical therapy and completed 4 sessions with mild improvement, but due to [place] has not been able to return and noticed worsening leg pain and new achy pain radiating down the right leg.  His pain occurs about 3-4 times per week and starts in the posterior thigh and radiates down both legs.  Pain is achy and throbbing.  He has very mild tingling.  It is worse with walking and alleviated with rest.  He has tried gabapentin in the past with no benefit.  He does not have low back pain or leg weakness.  No bowel/bladder incontinence.    Past Medical History:  Diagnosis Date  . Asthma   . Diabetes mellitus ([place])   . DIVERTICULITIS, HX OF 08/03/2009   Qualifier: Diagnosis of  By: Vanessa Kick MD, Saralyn Pilar    . Hypertension   .  Pneumonia     Current Outpatient Medications on File Prior to Visit  Medication Sig Dispense Refill  . amLODipine (NORVASC) 5 MG tablet TAKE 1 TABLET BY MOUTH EVERY DAY (Patient taking differently: Take 5 mg by mouth daily. ) 30 tablet 2  . atorvastatin (LIPITOR) 10 MG tablet Take 1 tablet (10 mg total) by mouth daily. 90 tablet 3  . Blood Glucose Monitoring Suppl (ACCU-CHEK COMPACT CARE KIT) KIT Use to check sugar three times a day 1 each 0  . [facility] VITAMIN B12 1000 MCG tablet TAKE 1 TABLET BY MOUTH EVERY DAY (Patient taking differently: Take 1,000 mcg by mouth daily. ) 30 tablet 2  . glucose blood (ACCU-CHEK COMPACT PLUS) test strip Use to check sugar three times a day 100 each 12  . Lancet Devices (LANCING DEVICE) MISC Use to check sugar three times a day 1 each 0  . Lancets (FREESTYLE) lancets Use to check sugar three times a day 100 each 12  . metFORMIN (GLUCOPHAGE-XR) 500 MG 24 hr tablet Take 2 tablets (1,000 mg total) by mouth 2 (two) times daily. 120 tablet 2  . polyethylene glycol powder (GLYCOLAX/MIRALAX) powder Take 17 g by mouth 2 (two) times daily as needed. 3350 g 1   No current facility-administered medications on file prior to visit.      Review of Systems:  CONSTITUTIONAL: No fevers, chills, night sweats, or weight loss.  EYES: No visual changes or eye pain ENT: No hearing changes.  No history  of nose bleeds.   RESPIRATORY: No cough, wheezing and shortness of breath.   CARDIOVASCULAR: Negative for chest pain, and palpitations.   GI: Negative for abdominal discomfort, blood in stools or black stools.  No recent change in bowel habits.   GU:  No history of incontinence.   MUSCLOSKELETAL: No history of joint pain or swelling.  No myalgias.   SKIN: Negative for lesions, rash, and itching.   ENDOCRINE: Negative for cold or heat intolerance, polydipsia or goiter.   PSYCH:  No depression or anxiety symptoms.   NEURO: As Above.  Observations/Objective:   Patient is awake,  alert, and appears comfortable.   Extraocular muscles are intact. No ptosis.  Face is symmetric.  Speech is not dysarthric. Antigravity in all extremities.    Assessment and Plan:  1.  Bilateral leg pain, ?MSK vs lumbosacral radiculopathy, worsening.  NCS/EMG of the left leg showed very mild S1 radiculopathy which I would not expect to manifest with the severity of his pain.  He was, however, having some improvement with physical therapy so I have asked him to continue home exercises and inquire whether they are offering virtual sessions.  I will send a prescription for tizanidine '2mg'$  at bedtime for pain. Side effects discussed.  Restart PT when able.   If no improvement, MRI lumbar spine is the next step.  2.  Left meralgia paresthetica, stable.   Follow Up Instructions:   I discussed the assessment and treatment plan with the patient. The patient was provided an opportunity to ask questions and all were answered. The patient agreed with the plan and demonstrated an understanding of the instructions.   The patient was advised to call back or seek an in-person evaluation if the symptoms worsen or if the condition fails to improve as anticipated.  Return to clinic if no improvement.    Alda Berthold, DO

## 2018-09-24 ENCOUNTER — Telehealth: Payer: Self-pay | Admitting: Neurology

## 2018-09-24 ENCOUNTER — Other Ambulatory Visit: Payer: Self-pay | Admitting: *Deleted

## 2018-09-24 MED ORDER — TIZANIDINE HCL 2 MG PO CAPS: 2.0000 mg | ORAL_CAPSULE | Freq: Every evening | ORAL | 3 refills | Status: DC | PRN

## 2018-09-24 NOTE — Telephone Encounter (Signed)
Wife calling in [facility] [address]. She said this was supposed to be sent. Thanks!

## 2018-09-24 NOTE — Telephone Encounter (Signed)
Rx sent in

## 2018-10-03 ENCOUNTER — Other Ambulatory Visit: Payer: Self-pay | Admitting: Family Medicine

## 2018-10-03 MED ORDER — ATORVASTATIN CALCIUM 10 MG PO TABS: 10.0000 mg | ORAL_TABLET | Freq: Every day | ORAL | 3 refills | Status: DC

## 2018-11-05 ENCOUNTER — Ambulatory Visit: Payer: Medicaid Other

## 2018-11-06 ENCOUNTER — Ambulatory Visit: Payer: Medicaid Other | Admitting: Family Medicine

## 2018-11-08 ENCOUNTER — Emergency Department (HOSPITAL_COMMUNITY)
Admission: EM | Admit: 2018-11-08 | Discharge: 2018-11-08 | Disposition: A | Discharge status: Home / Self Care | Payer: Medicaid Other | Attending: Emergency Medicine | Admitting: Emergency Medicine

## 2018-11-08 ENCOUNTER — Other Ambulatory Visit: Payer: Self-pay

## 2018-11-08 ENCOUNTER — Encounter (HOSPITAL_COMMUNITY): Payer: Self-pay | Admitting: Emergency Medicine

## 2018-11-08 ENCOUNTER — Emergency Department (HOSPITAL_COMMUNITY): Payer: Medicaid Other

## 2018-11-08 DIAGNOSIS — E119 Type 2 diabetes mellitus without complications: Secondary | ICD-10-CM | POA: Diagnosis not present

## 2018-11-08 DIAGNOSIS — Z7984 Long term (current) use of oral hypoglycemic drugs: Secondary | ICD-10-CM | POA: Diagnosis not present

## 2018-11-08 DIAGNOSIS — S6992XA Unspecified injury of left wrist, hand and finger(s), initial encounter: Secondary | ICD-10-CM | POA: Insufficient documentation

## 2018-11-08 DIAGNOSIS — Z79899 Other long term (current) drug therapy: Secondary | ICD-10-CM | POA: Insufficient documentation

## 2018-11-08 DIAGNOSIS — J45909 Unspecified asthma, uncomplicated: Secondary | ICD-10-CM | POA: Insufficient documentation

## 2018-11-08 DIAGNOSIS — S6992XS Unspecified injury of left wrist, hand and finger(s), sequela: Secondary | ICD-10-CM

## 2018-11-08 DIAGNOSIS — I10 Essential (primary) hypertension: Secondary | ICD-10-CM | POA: Diagnosis not present

## 2018-11-08 DIAGNOSIS — W230XXA Caught, crushed, jammed, or pinched between moving objects, initial encounter: Secondary | ICD-10-CM | POA: Diagnosis not present

## 2018-11-08 DIAGNOSIS — F17200 Nicotine dependence, unspecified, uncomplicated: Secondary | ICD-10-CM | POA: Insufficient documentation

## 2018-11-08 DIAGNOSIS — Y999 Unspecified external cause status: Secondary | ICD-10-CM | POA: Insufficient documentation

## 2018-11-08 DIAGNOSIS — Y9389 Activity, other specified: Secondary | ICD-10-CM | POA: Diagnosis not present

## 2018-11-08 DIAGNOSIS — Y92810 Car as the place of occurrence of the external cause: Secondary | ICD-10-CM | POA: Insufficient documentation

## 2018-11-08 NOTE — ED Provider Notes (Signed)
Medical screening examination/treatment/procedure(s) were conducted as a shared visit with non-physician practitioner(s) and myself.  I personally evaluated the patient during the encounter.  None Patient crushed his finger in a car door last night.  He reports he has severe pain.  Is his fifth digit on the left hand.  Objectively, no traumatic soft tissue damage.  All skin surfaces intact.  No significant swelling or contusion.  No abrasions.  Patient is very tender to palpation over the entirety of the finger.  No other traumatic injury to the hand.  X-ray negative.  At this time patient is stable for splinting and outpatient follow-up with hand surgery for possible tendon injury   Arby Barrette, MD 11/08/18 [phone]

## 2018-11-08 NOTE — ED Provider Notes (Signed)
[facility] Provider Note   CSN: [id number] Arrival date & time: 11/08/18  7341    History   Chief Complaint Chief Complaint  Patient presents with  . Hand Injury    HPI ROLANDO HESSLING is a 38 y.o. male.     HPI   Patient is a 38 year old male past medical history of hypertension, diabetes, asthma presenting for injury to the left small finger.  Patient reports that last night when he was getting out of his car he slammed the car door in his left small finger was caught in the door.  He reports that it compressed his finger between the MCP and PIP joint.  Patient reports that he was able to flex and extend the finger last night however this morning he woke up it is very swollen and he is having difficulty extending the finger.  He reports that he feels some decreased sensation distally but has no loss of sensation.  Past Medical History:  Diagnosis Date  . Asthma   . Diabetes mellitus ([place])   . DIVERTICULITIS, HX OF 08/03/2009   Qualifier: Diagnosis of  By: Vanessa Kick MD, Saralyn Pilar    . Hypertension   . Pneumonia     Patient Active Problem List   Diagnosis Date Noted  . Need for immunization against influenza 02/12/2018  . Straining with stools 02/12/2018  . Diverticulitis   . Sepsis ([place])   . Microscopic hematuria 07/26/2017  . Type 2 diabetes mellitus ([place]) 07/13/2017  . Essential hypertension 07/04/2017  . Severe obesity (BMI >= 40) ([place]) 02/19/2017  . Diverticulosis of colon 02/19/2017  . Tobacco use disorder 02/19/2017  . Meralgia paresthetica, left 06/07/2016    Past Surgical History:  Procedure Laterality Date  . WISDOM TOOTH EXTRACTION    . WRIST SURGERY          Home Medications    Prior to Admission medications   Medication Sig Start Date End Date Taking? Authorizing Provider  amLODipine (NORVASC) 5 MG tablet TAKE 1 TABLET BY MOUTH EVERY DAY 10/03/18   Bonnita Hollow, MD  atorvastatin (LIPITOR) 10 MG tablet  Take 1 tablet (10 mg total) by mouth daily. 10/03/18   Bonnita Hollow, MD  Blood Glucose Monitoring Suppl (ACCU-CHEK COMPACT CARE KIT) KIT Use to check sugar three times a day 07/19/17   Mikell, Jeani Sow, MD  [facility] VITAMIN B12 1000 MCG tablet TAKE 1 TABLET BY MOUTH EVERY DAY Patient taking differently: Take 1,000 mcg by mouth daily.  12/28/17   Bonnita Hollow, MD  glucose blood (ACCU-CHEK COMPACT PLUS) test strip Use to check sugar three times a day 07/13/17   Smiley Houseman, MD  Lancet Devices (LANCING DEVICE) MISC Use to check sugar three times a day 07/16/17   Tonette Bihari, MD  Lancets (FREESTYLE) lancets Use to check sugar three times a day 07/19/17   Mikell, Jeani Sow, MD  metFORMIN (GLUCOPHAGE-XR) 500 MG 24 hr tablet Take 2 tablets (1,000 mg total) by mouth 2 (two) times daily. 02/12/18   Sherene Sires, DO  polyethylene glycol powder (GLYCOLAX/MIRALAX) powder Take 17 g by mouth 2 (two) times daily as needed. 02/12/18   Sherene Sires, DO  tizanidine (ZANAFLEX) 2 MG capsule Take 1 capsule (2 mg total) by mouth at bedtime as needed for muscle spasms. 09/24/18   Alda Berthold, DO    Family History Family History  Problem Relation Age of Onset  . Diabetes Mother   .  Alcohol abuse Mother   . Asthma Mother   . Hypertension Mother   . Breast cancer Mother   . Diabetes Father   . Drug abuse Sister   . Diabetes Maternal Grandmother   . Diabetes Maternal Aunt   . Diabetes Cousin        maternal cousin    Social History Social History   Tobacco Use  . Smoking status: Current Every Day Smoker    Packs/day: 0.50    Years: 20.00    Pack years: 10.00  . Smokeless tobacco: Never Used  Substance Use Topics  . Alcohol use: Yes    Comment: 2-3 beer socially  . Drug use: Yes    Types: Marijuana     Allergies   Patient has no known allergies.   Review of Systems Review of Systems  Musculoskeletal: Positive for arthralgias and joint swelling.  Skin: Negative for wound.   Neurological: Negative for weakness and numbness.     Physical Exam Updated Vital Signs BP (!) 163/111 (BP Location: Right Arm)   Pulse 94   Temp 99.1 F (37.3 C) (Oral)   Resp 17   Ht _0  (1.753 m)   Wt 124.7 kg   SpO2 100%   BMI 40.61 kg/m   Physical Exam Vitals signs and nursing note reviewed.  Constitutional:      General: He is not in acute distress.    Appearance: He is well-developed. He is not diaphoretic.     Comments: Sitting comfortably in bed.  HENT:     Head: Normocephalic and atraumatic.  Eyes:     General:        Right eye: No discharge.        Left eye: No discharge.     Conjunctiva/sclera: Conjunctivae normal.     Comments: EOMs normal to gross examination.  Neck:     Musculoskeletal: Normal range of motion.  Cardiovascular:     Rate and Rhythm: Normal rate and regular rhythm.     Comments: Intact, 2+ radial and ulnar pulse. Abdominal:     General: There is no distension.  Musculoskeletal:     Comments: Left small finger with swelling between MCP and PIP joint. Left small finger held in static flexion. He is able to extend finger slightly but not to 180 degrees. He is able to flex all joints of the left small finger. Intact distal sensation. Capillary refill less than 2 seconds.   Skin:    General: Skin is warm and dry.  Neurological:     Mental Status: He is alert.     Comments: Cranial nerves intact to gross observation. Patient moves extremities without difficulty.  Psychiatric:        Behavior: Behavior normal.        Thought Content: Thought content normal.        Judgment: Judgment normal.      ED Treatments / Results  Labs (all labs ordered are listed, but only abnormal results are displayed) Labs Reviewed - No data to display  EKG None  Radiology No results found.  Procedures Procedures (including critical care time)  Medications Ordered in ED Medications - No data to display   Initial Impression / Assessment and Plan  / ED Course  I have reviewed the triage vital signs and the nursing notes.  Pertinent labs & imaging results that were available during my care of the patient were reviewed by me and considered in my medical decision making (see chart  for details).  Clinical Course as of Nov 08 711  Fri Nov 08, 2018  0707 Patient has history of HTN.   BP(!): 163/111 [AM]    Clinical Course User Index [AM] Albesa Seen, PA-C       This is a well-appearing 38 year old male presenting for injury to the left small finger.  Finger is neurovascularly intact. No wound on skin surface.  There is moderate swelling.  Will obtain x-ray.  X-ray without any bony abnormality, reviewed by me. Hand examined by attending physician Dr. Charlesetta Shanks.  No appearance of traumatic soft tissue injury.  Unlikely rupture of extensor tendon.  Will apply static finger splint and have patient follow-up with hand surgery.  Patient to return precautions for increasing pain, swelling, paresthesias or discoloration.  Patient is in understanding and agrees with plan of care.  Blood pressure elevated. Pt does take antihypertensives.   This is a shared visit with Dr. Charlesetta Shanks. Patient was independently evaluated by this attending physician. Attending physician consulted in evaluation and discharge management.  Final Clinical Impressions(s) / ED Diagnoses   Final diagnoses:  Finger injury, left, sequela    ED Discharge Orders    None       Tamala Julian 11/08/18 0809    Charlesetta Shanks, MD 11/23/18 1426

## 2018-11-08 NOTE — ED Triage Notes (Signed)
Pt reports slamming his Left pinky finger in the car door, woke up and was unable to move finger.

## 2018-11-08 NOTE — Discharge Instructions (Addendum)
Please see the information and instructions below regarding your visit.  Your diagnoses today include:  1. Finger injury, left, sequela     Tests performed today include: See side panel of your discharge paperwork for testing performed today. Vital signs are listed at the bottom of these instructions.   There appears to be no fracture in the finger as evidenced on x-ray.  Likely sprain of your finger.  Medications prescribed:    Take any prescribed medications only as prescribed, and any over the counter medications only as directed on the packaging.  I recommend Tylenol as needed for pain.  Home care instructions:  Please follow any educational materials contained in this packet.   Continue to apply ice, 10 minutes on, 10 minutes off, repeated 3-4 times daily. Make sure to apply a barrier between your skin and the ice to prevent superficial nerve damage.   Follow-up instructions: Please follow-up with Dr. Amanda Pea of hand surgery if your range of motion of the finger and ability to extend it is not improving within 5 days.   Return instructions:  Please return to the Emergency Department if you experience worsening symptoms.  Please contact emergency department if you develop any worsening pain, swelling, or discoloration of the finger. Please return if you have any other emergent concerns.  Additional Information:   Your vital signs today were: BP (!) 163/111 (BP Location: Right Arm)    Pulse 94    Temp 99.1 F (37.3 C) (Oral)    Resp 17    Ht 5\' 9"  (1.753 m)    Wt 124.7 kg    SpO2 100%    BMI 40.61 kg/m  If your blood pressure (BP) was elevated on multiple readings during this visit above 130 for the top number or above 80 for the bottom number, please have this repeated by your primary care provider within one month. --------------  Thank you for allowing [place] to participate in your care today.

## 2018-11-20 DIAGNOSIS — M79645 Pain in left finger(s): Secondary | ICD-10-CM | POA: Diagnosis not present

## 2019-01-21 ENCOUNTER — Telehealth: Payer: Self-pay | Admitting: Neurology

## 2019-01-21 NOTE — Telephone Encounter (Signed)
[facility] [address] [facility] Zan[facility]lex but if we cant get the auth please see if there is another medication that would work just like that please call and let them know the status

## 2019-02-18 ENCOUNTER — Encounter: Payer: Medicaid Other | Admitting: Family Medicine

## 2019-02-24 ENCOUNTER — Encounter: Payer: Medicaid Other | Admitting: Family Medicine

## 2019-03-08 ENCOUNTER — Other Ambulatory Visit: Payer: Self-pay | Admitting: Family Medicine

## 2019-03-08 DIAGNOSIS — E119 Type 2 diabetes mellitus without complications: Secondary | ICD-10-CM

## 2019-03-17 ENCOUNTER — Encounter: Payer: Medicaid Other | Admitting: Family Medicine

## 2019-03-24 ENCOUNTER — Encounter: Payer: Medicaid Other | Admitting: Family Medicine

## 2019-05-15 ENCOUNTER — Other Ambulatory Visit: Payer: Self-pay

## 2019-05-15 ENCOUNTER — Telehealth (INDEPENDENT_AMBULATORY_CARE_PROVIDER_SITE_OTHER): Payer: Medicaid Other | Admitting: Family Medicine

## 2019-05-15 DIAGNOSIS — Z20828 Contact with and (suspected) exposure to other viral communicable diseases: Secondary | ICD-10-CM

## 2019-05-15 DIAGNOSIS — Z20822 Contact with and (suspected) exposure to covid-19: Secondary | ICD-10-CM

## 2019-05-15 NOTE — Progress Notes (Signed)
[facility] Telemedicine Visit  Patient consented to have virtual visit. Method of visit: Video  Encounter participants: Patient: Andre Reynolds - located at home in [facility] Provider: Nuala Alpha - located at [facility] Others (if applicable): None  Chief Complaint: exposure to COVID-19  HPI:  Patient said his dad started having cough and other symptoms consistent with COVID-19. He wore a mask and picked his dad up and took him to the ED. His dad did test positive for COVID-19. He had not been around his dad otherwise. He has no symptoms at this time. He denies fever, cough, difficulty breathing, SOB, chest pain, headaches, nausea, or vomiting. No loss of taste or smell. No other family member was exposed to his dad.   ROS: per HPI  Pertinent PMHx: HTN, T2DM, Hx of Diverticulitis  Exam:  Gen: NAD Respiratory: speaks normally in full sentences without needing to stop to catch his breath Skin: no rashes on the face  Assessment/Plan:  Close exposure to COVID-19 virus Patient took proper precautions and practiced good hygiene but was in close contact with known person who tested positive for COVID-19. - Advised he get COVID tested to ensure he is not asymptomatic yet has COVID - Discussed reasons to seek emergency care if he develops symptoms of shortness of breath, chest pain, difficulty breathing, etc - Discussed need to self-quarantine until results from test are back and how to self-quarantine if positive but no red flag symptoms    Time spent during visit with patient: >15 minutes  Harolyn Rutherford, DO [facility], PGY-3

## 2019-05-15 NOTE — Assessment & Plan Note (Signed)
Patient took proper precautions and practiced good hygiene but was in close contact with known person who tested positive for COVID-19. - Advised he get COVID tested to ensure he is not asymptomatic yet has COVID - Discussed reasons to seek emergency care if he develops symptoms of shortness of breath, chest pain, difficulty breathing, etc - Discussed need to self-quarantine until results from test are back and how to self-quarantine if positive but no red flag symptoms

## 2019-05-16 ENCOUNTER — Other Ambulatory Visit: Payer: Self-pay

## 2019-05-16 DIAGNOSIS — Z20822 Contact with and (suspected) exposure to covid-19: Secondary | ICD-10-CM

## 2019-05-16 DIAGNOSIS — Z20828 Contact with and (suspected) exposure to other viral communicable diseases: Secondary | ICD-10-CM | POA: Diagnosis not present

## 2019-05-20 LAB — NOVEL CORONAVIRUS, NAA: SARS-CoV-2, NAA: NOT DETECTED

## 2019-05-25 ENCOUNTER — Telehealth: Payer: Self-pay | Admitting: Family Medicine

## 2019-05-25 NOTE — Telephone Encounter (Signed)
**  After Hours/ Emergency Line Call**  Received a call to report that Andre Reynolds reporting emesis.  Patient's wife reports he vomited and noticed "Fusco ball". Wife opened it and noted foamy with powdery Bergh substance. Just one time today. Has not been eating anything abnormal. He does have burning sensation when he eats spicy food. No fevers. No abdominal pain. No diarrhea. No blood in stool or vomit. No recent tobacco or drug use. Did have 1 beer today, but that was after vomiting. He feels fine right now. No further emesis but does have nausea. Recommended that patient be seen in clinic. F/u appointment made for 12/15 in [facility] with Dr. Grandville Silos, who is also patient's PCP. Red flags discussed.  Will forward to PCP.  Caroline More, DO PGY-3, [facility] 05/25/2019 6:19 PM

## 2019-05-27 ENCOUNTER — Telehealth (INDEPENDENT_AMBULATORY_CARE_PROVIDER_SITE_OTHER): Payer: Medicaid Other | Admitting: Family Medicine

## 2019-05-27 ENCOUNTER — Other Ambulatory Visit: Payer: Self-pay

## 2019-05-27 DIAGNOSIS — R112 Nausea with vomiting, unspecified: Secondary | ICD-10-CM

## 2019-05-27 DIAGNOSIS — R198 Other specified symptoms and signs involving the digestive system and abdomen: Secondary | ICD-10-CM

## 2019-05-27 DIAGNOSIS — K5900 Constipation, unspecified: Secondary | ICD-10-CM

## 2019-05-27 MED ORDER — POLYETHYLENE GLYCOL 3350 17 GM/SCOOP PO POWD: 17.0000 g | Freq: Two times a day (BID) | ORAL | 1 refills | Status: AC | PRN

## 2019-05-27 MED ORDER — ONDANSETRON 4 MG PO TBDP: 4.0000 mg | ORAL_TABLET | Freq: Three times a day (TID) | ORAL | 0 refills | Status: DC | PRN

## 2019-05-27 NOTE — Progress Notes (Signed)
[place] [facility] Telemedicine Visit  Patient consented to have virtual visit. Method of visit: Video was attempted, but technology challenges prevented patient from using video, so visit was conducted via telephone.  Encounter participants: Patient: Andre Reynolds - located at home Provider: Bonnita Hollow - located at office Others (if applicable): significant other  Chief Complaint: Nausea  HPI:  Patient reports that he has had nausea for 3 days.  Also endorses one episode of vomiting 2 days ago.  Is NBNB.  Has not vomited since.  Denies diarrhea, fevers, chills, abdominal pain, dysuria, hematuria.  No sick contacts.  Patient is able to tolerate p.o. Does endorse being constipated for the past 3 days.  Does have a history of constipation.  He has not take anything for his constipation, he has taken MiraLAX in the past.  ROS: per HPI  Pertinent PMHx: Chronic constipation  Exam:  Respiratory: Able to speak in full sentences without issue  Assessment/Plan: Nausea and vomiting Patient has had mild course of nausea and vomiting.  This is without any significant abdominal pain, UTI symptoms, other infectious symptoms.  He is tolerating p.o. and is low risk for dehydration.  Will treat symptomatically with Zofran.  Patient told to follow-up in a few days if no improvement or if things get worse.  Constipation Patient with chronic constipation.  He has not been taking his MiraLAX.  Recommend restarting this.  Also encourage patient to try to hydrate well with water, increase fiber with fruits and vegetables.  Patient follow-up if no improvement.  Time spent during visit with patient: 8 minutes

## 2019-06-01 ENCOUNTER — Telehealth: Payer: Self-pay | Admitting: Family Medicine

## 2019-06-01 NOTE — Telephone Encounter (Signed)
**  After Hours/ Emergency Line Call**  Received a call to report that Lovina Reach with leg pain that radiates to feet.     Patient is in a lot of pain with legs. He cannot feel the bottom of his feet. Sees a neurologist "but she's not helping". 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 Denies incontinence. Pain is in upper thighs. Reports pain is in thighs but radiates to his feet. Feel like "being stuck with needles. Denies saddle anesthesia. Right side is worse than left. Denies fever or trauma to the area.   Recommended that patient be seen by a physician, urgent care vs. Clinic appointment in AM. Unfortunately there were no appointment times that would work with patient's schedule but wife states they will go to UC today.  Red flags discussed.  Will forward to PCP.  Caroline More, DO PGY-3, [facility] 06/01/2019 3:43 PM

## 2019-08-10 ENCOUNTER — Encounter: Payer: Self-pay | Admitting: Family Medicine

## 2019-08-11 ENCOUNTER — Other Ambulatory Visit: Payer: Self-pay | Admitting: Family Medicine

## 2019-08-11 DIAGNOSIS — M79605 Pain in left leg: Secondary | ICD-10-CM

## 2019-08-29 ENCOUNTER — Telehealth (INDEPENDENT_AMBULATORY_CARE_PROVIDER_SITE_OTHER): Payer: Medicaid Other | Admitting: Family Medicine

## 2019-08-29 DIAGNOSIS — Z5329 Procedure and treatment not carried out because of patient's decision for other reasons: Secondary | ICD-10-CM

## 2019-08-29 NOTE — Progress Notes (Signed)
Called 3 times throughout the afternoon. No answer.

## 2019-09-22 ENCOUNTER — Encounter: Payer: Self-pay | Admitting: Family Medicine

## 2019-10-05 ENCOUNTER — Other Ambulatory Visit: Payer: Self-pay | Admitting: Family Medicine

## 2019-11-07 ENCOUNTER — Ambulatory Visit: Payer: Self-pay | Admitting: Neurology

## 2019-11-07 ENCOUNTER — Telehealth: Payer: Self-pay | Admitting: Neurology

## 2019-11-07 ENCOUNTER — Encounter: Payer: Self-pay | Admitting: Neurology

## 2019-11-07 NOTE — Telephone Encounter (Signed)
This patient did not show for a new patient appointment today. 

## 2019-11-30 ENCOUNTER — Other Ambulatory Visit: Payer: Self-pay | Admitting: Family Medicine

## 2019-11-30 DIAGNOSIS — E119 Type 2 diabetes mellitus without complications: Secondary | ICD-10-CM

## 2020-01-21 ENCOUNTER — Ambulatory Visit: Payer: Medicaid Other | Admitting: Neurology

## 2020-02-17 ENCOUNTER — Encounter: Payer: Self-pay | Admitting: Neurology

## 2020-02-17 ENCOUNTER — Other Ambulatory Visit: Payer: Self-pay

## 2020-02-17 ENCOUNTER — Ambulatory Visit: Payer: Medicaid Other | Admitting: Neurology

## 2020-02-17 VITALS — BP 132/84 | HR 87 | Ht 68.0 in | Wt 302.3 lb

## 2020-02-17 DIAGNOSIS — M545 Low back pain: Secondary | ICD-10-CM | POA: Diagnosis not present

## 2020-02-17 DIAGNOSIS — M79604 Pain in right leg: Secondary | ICD-10-CM

## 2020-02-17 DIAGNOSIS — M79605 Pain in left leg: Secondary | ICD-10-CM

## 2020-02-17 NOTE — Patient Instructions (Signed)
It was nice to meet you today.  I would like to investigate your low back pain with a lumbar spine or lower back spinal MRI with and without contrast.  We will request insurance authorization and call you to schedule your test.  I believe your best next option for management is to see a spine specialist through neurosurgery or orthopedic surgery, I would be happy to make a referral after reviewing your test result.  You had electrical nerve and muscle testing in December 2019 with Dr. Allena Katz without any obvious evidence of widespread nerve damage thankfully.  Based on your exam, I do not believe you need to have a repeat test.   You can use over-the-counter Advil or Tylenol alternating for your back pain, sometimes a heat pad can help as well.

## 2020-02-17 NOTE — Progress Notes (Signed)
Subjective:    Patient ID: Andre Reynolds is a 39 y.o. male.  HPI     Star Age, MD, PhD [facility] [address], [address] [place], [place] [address]  Dear Dr. Wendy Poet,   I saw your patient, Andre Reynolds, upon your kind request in my neurologic clinic today for initial consultation of his low back pain, for second opinion.  The patient is accompanied by his wife today.  As you know, Andre Reynolds is a 39 year old right-handed gentleman with an underlying medical history of diabetes, diverticulitis, hypertension, hyperlipidemia, history of pneumonia, and morbid obesity with a BMI of over 40, who reports a approximately 3-year history of low back pain with radiation to the left leg.  He reports that lately, his radiating pain can affect the right leg but primarily affects the left leg.  He has not fallen recently.  He denies any bowel or bladder dysfunction.  He saw Dr. Posey Pronto at [facility] neurology in 2019 and had work-up as well as symptomatic treatment for this.  He had an EMG nerve conduction velocity testing which showed some evidence of radiculopathy affecting the S1 root.  She felt there was no evidence of widespread neuropathy.   EMG/NCV from 05/14/2018 showed: Impression:  1. Chronic S1 radiculopathy affecting the left lower extremity,  very mild in degree electrically.  2. There is no evidence of a sensorimotor polyneuropathy  affecting the lower extremities.   He had physical therapy.  He was tried on muscle relaxers.  He tried gabapentin prior to that which did not help.  He has been using over-the-counter Aleve which does not always help.  He has never seen a spine specialist.  He has never had any dedicated MRI of the lumbar spine.  He denies any radiating neck pain.  He does have some numbness affecting his left leg, with tingling radiating down his left leg to the left lateral foot.  The pain has been gradually progressive, no sudden onset of  symptoms.  His Past Medical History Is Significant For: Past Medical History:  Diagnosis Date  . Asthma   . Diabetes mellitus ([place])   . DIVERTICULITIS, HX OF 08/03/2009   Qualifier: Diagnosis of  By: Vanessa Kick MD, Saralyn Pilar    . Hypertension   . Pneumonia     His Past Surgical History Is Significant For: Past Surgical History:  Procedure Laterality Date  . WISDOM TOOTH EXTRACTION    . WRIST SURGERY      His Family History Is Significant For: Family History  Problem Relation Age of Onset  . Diabetes Mother   . Alcohol abuse Mother   . Asthma Mother   . Hypertension Mother   . Breast cancer Mother   . Diabetes Father   . Drug abuse Sister   . Diabetes Maternal Grandmother   . Diabetes Maternal Aunt   . Diabetes Cousin        maternal cousin    His Social History Is Significant For: Social History   Socioeconomic History  . Marital status: Married    Spouse name: Not on file  . Number of children: 6  . Years of education: 62  . Highest education level: Not on file  Occupational History  . Occupation: not working  Tobacco Use  . Smoking status: Current Every Day Smoker    Packs/day: 0.50    Years: 20.00    Pack years: 10.00  . Smokeless tobacco: Never Used  Vaping Use  .  Vaping Use: Never used  Substance and Sexual Activity  . Alcohol use: Yes    Comment: 2-3 beer socially  . Drug use: Yes    Types: Marijuana  . Sexual activity: Yes    Partners: Female  Other Topics Concern  . Not on file  Social History Narrative   Unemployed.  Lives with wife and children in a one story home.  Has 6 children.  Education: 10th grade.   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       Social Determinants of Health   Financial Resource Strain:   . Difficulty of Paying Living Expenses: Not on file  Food Insecurity:   . Worried About Charity fundraiser in the Last Year: Not on file  . Ran Out of Food in the Last Year: Not on file  Transportation Needs:   . Lack of Transportation  (Medical): Not on file  . Lack of Transportation (Non-Medical): Not on file  Physical Activity:   . Days of Exercise per Week: Not on file  . Minutes of Exercise per Session: Not on file  Stress:   . Feeling of Stress : Not on file  Social Connections:   . Frequency of Communication with Friends and Family: Not on file  . Frequency of Social Gatherings with Friends and Family: Not on file  . Attends Religious Services: Not on file  . Active Member of Clubs or Organizations: Not on file  . Attends Archivist Meetings: Not on file  . Marital Status: Not on file    His Allergies Are:  No Known Allergies:   His Current Medications Are:  Outpatient Encounter Medications as of 02/17/2020  Medication Sig  . amLODipine (NORVASC) 5 MG tablet TAKE 1 TABLET BY MOUTH EVERY DAY  . atorvastatin (LIPITOR) 10 MG tablet TAKE 1 TABLET BY MOUTH EVERY DAY  . Blood Glucose Monitoring Suppl (ACCU-CHEK COMPACT CARE KIT) KIT Use to check sugar three times a day  . glucose blood (ACCU-CHEK COMPACT PLUS) test strip Use to check sugar three times a day  . Lancet Devices (LANCING DEVICE) MISC Use to check sugar three times a day  . Lancets (FREESTYLE) lancets Use to check sugar three times a day  . metFORMIN (GLUCOPHAGE-XR) 500 MG 24 hr tablet TAKE 2 TABLETS BY MOUTH TWICE A DAY  . polyethylene glycol powder (GLYCOLAX/MIRALAX) 17 GM/SCOOP powder Take 17 g by mouth 2 (two) times daily as needed.  . [DISCONTINUED] [employer] VITAMIN B12 1000 MCG tablet TAKE 1 TABLET BY MOUTH EVERY DAY (Patient taking differently: Take 1,000 mcg by mouth daily. )  . [DISCONTINUED] ondansetron (ZOFRAN ODT) 4 MG disintegrating tablet Take 1 tablet (4 mg total) by mouth every 8 (eight) hours as needed for nausea or vomiting.  . [DISCONTINUED] tizanidine (ZANAFLEX) 2 MG capsule Take 1 capsule (2 mg total) by mouth at bedtime as needed for muscle spasms.   No facility-administered encounter medications on file as of 02/17/2020.   :   Review of Systems:  Out of a complete 14 point review of systems, all are reviewed and negative with the exception of these symptoms as listed below:  Review of Systems  Neurological:       Here for consult on worsening leg pain. Pt reports pain in lower legs and lower back for several months now. Pt reports the pain is now starting to effect his walking and numbness in his feet is present.   He has seen Dr. Posey Pronto in  the past. 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 Disc height loss and bulging presumably impinges on the bilateral L5 foraminal nerves.( report in epic).     Objective:  Neurological Exam  Physical Exam Physical Examination:   Vitals:   02/17/20 0958  BP: 132/84  Pulse: 87  SpO2: 96%    General Examination: The patient is a very pleasant 39 y.o. male in no acute distress. He appears well-developed and well-nourished and well groomed.   HEENT: Normocephalic, atraumatic, pupils are equal, round and reactive to light and accommodation.  Corrective eyeglasses in place.  Extraocular tracking is good without limitation to gaze excursion or nystagmus noted. Normal smooth pursuit is noted. Hearing is grossly intact. Face is symmetric with normal facial animation and normal facial sensation. Speech is clear with no dysarthria noted. There is no hypophonia. There is no lip, neck/head, jaw or voice tremor. Neck is supple with full range of passive and active motion. There are no carotid bruits on auscultation. Oropharynx exam reveals: mild mouth dryness, adequate dental hygiene.  Tongue protrudes centrally in palate elevates symmetrically.   Chest: Clear to auscultation without wheezing, rhonchi or crackles noted.  Heart: S1+S2+0, regular and normal without murmurs, rubs or gallops noted.   Abdomen: Soft, non-tender and non-distended with normal bowel sounds appreciated  on auscultation.  Extremities: There is no pitting edema in the distal lower extremities bilaterally. Pedal pulses are intact.  Skin: Warm and dry without trophic changes noted.   Musculoskeletal: exam reveals some left-sided low back pain.  Some radiation to the left posterior leg.    Neurologically:  Mental status: The patient is awake, alert and oriented in all 4 spheres. His immediate and remote memory, attention, language skills and fund of knowledge are appropriate. There is no evidence of aphasia, agnosia, apraxia or anomia. Speech is clear with normal prosody and enunciation. Thought process is linear. Mood is normal and affect is normal.  Cranial nerves II - XII are as described above under HEENT exam. In addition: shoulder shrug is normal with equal shoulder height noted. Motor exam: Normal bulk, strength and tone is noted, mild discomfort reported with the left hip flexion but strength is good. There is no drift, tremor or rebound. Romberg is negative. Reflexes are 2+ throughout. Babinski: Toes are flexor bilaterally. Fine motor skills and coordination: intact with normal finger taps, normal hand movements, normal rapid alternating patting, normal foot taps and normal foot agility.  Cerebellar testing: No dysmetria or intention tremor on finger to nose testing. Heel to shin is unremarkable bilaterally. There is no truncal or gait ataxia.  Sensory exam: intact to light touch, vibration, temperature sense in the upper and lower extremities.  Gait, station and balance: He stands easily. No veering to one side is noted. No leaning to one side is noted. Posture is age-appropriate and stance is narrow based. Gait shows normal stride length and normal pace. No problems turning are noted. Tandem walk is unremarkable. Intact toe stance, able to walk on heels.                Assessment and Plan:  In summary, CASPER PAGLIUCA is a very pleasant 39 y.o.-year old male  with an underlying medical  history of diabetes, diverticulitis, hypertension, hyperlipidemia, history of pneumonia, and morbid obesity with a BMI of over 40, who presents for evaluation of his chronic low  back pain of about 3 years duration.  He requested a second opinion.  He had evaluation in 2019 with Dr. Posey Pronto.  He did have some evidence of S1 radiculopathy.  No widespread neuropathy was found.  Exam is quite benign today and he is largely reassured.  Differential diagnosis does include radiculopathy, degenerative lumbar spine disease, herniated disc.  No red flags at this time.  In particular, he has not fallen, no evidence of foot drop, no bowel or bladder dysfunction.  No sensory level noted, in fact, normal sensation today.  Strength is good as well.  He has not had a lumbar spine MRI.  I would favor looking with an MRI next.  I would not recommend repeating EMG nerve conduction velocity testing quite yet.  He is advised that he would likely benefit from evaluation through a spine specialist through orthopedics or neurosurgery, we can certainly facilitate a referral.  For now, we will proceed with a lumbar spine MRI with and without contrast.  He has had some symptomatic treatment with muscle relaxer and has also tried gabapentin before.  He has had physical therapy.  I did not suggest any new medications but he can certainly try local heat, over-the-counter Advil and/or Tylenol alternating.  He is advised that we will keep him posted as to his MRI results by phone call and take it from there.  I answered all their questions today and the patient and his wife are in agreement.   Thank you very much for allowing me to participate in the care of this nice patient. If I can be of any further assistance to you please do not hesitate to call me at [phone].  Sincerely,   Star Age, MD, PhD

## 2020-02-18 ENCOUNTER — Telehealth: Payer: Self-pay | Admitting: Neurology

## 2020-02-18 NOTE — Telephone Encounter (Signed)
mcd healthy blue pending  

## 2020-02-24 NOTE — Telephone Encounter (Signed)
Checked the status it is still pending  

## 2020-02-26 NOTE — Telephone Encounter (Signed)
Correction the right Berkley Harvey is [id number] (exp. 02/18/20 to 03/19/20) order sent to GI.

## 2020-02-26 NOTE — Telephone Encounter (Signed)
mcd Healthy blue Berkley Harvey: [id number] (exp. 02/18/20 to 04/17/20) order sent to GI . They will reach out to the patient to schedule.

## 2020-02-26 NOTE — Telephone Encounter (Signed)
Checked status it is still pending.  

## 2020-03-03 ENCOUNTER — Other Ambulatory Visit: Payer: Medicaid Other

## 2020-03-24 ENCOUNTER — Ambulatory Visit
Admission: RE | Admit: 2020-03-24 | Discharge: 2020-03-24 | Disposition: A | Discharge status: Home / Self Care | Payer: Medicaid Other | Source: Ambulatory Visit | Attending: Neurology | Admitting: Neurology

## 2020-03-24 DIAGNOSIS — M79605 Pain in left leg: Secondary | ICD-10-CM | POA: Diagnosis not present

## 2020-03-24 DIAGNOSIS — M79604 Pain in right leg: Secondary | ICD-10-CM

## 2020-03-24 DIAGNOSIS — M545 Low back pain, unspecified: Secondary | ICD-10-CM

## 2020-03-24 MED ORDER — GADOBENATE DIMEGLUMINE 529 MG/ML IV SOLN
20.0000 mL | Freq: Once | INTRAVENOUS | Status: AC | PRN
  Administered 2020-03-24: 20 mL via INTRAVENOUS

## 2020-03-25 NOTE — Progress Notes (Signed)
Please call patient regarding his lumbar spine MRI and advise him that he has degenerative changes at L5-S1 resulting in disc bulging, and can result in pinched nerve type symptoms.  I would like to refer him to a spine specialist through orthopedics.  If he is agreeable, please place a referral for degenerative spondylosis of the lumbar spine with radiating low back pain to the left leg.

## 2020-03-26 ENCOUNTER — Other Ambulatory Visit: Payer: Self-pay | Admitting: *Deleted

## 2020-03-26 DIAGNOSIS — E119 Type 2 diabetes mellitus without complications: Secondary | ICD-10-CM

## 2020-03-29 ENCOUNTER — Encounter: Payer: Self-pay | Admitting: Family Medicine

## 2020-03-29 ENCOUNTER — Telehealth: Payer: Self-pay

## 2020-03-29 DIAGNOSIS — M5417 Radiculopathy, lumbosacral region: Secondary | ICD-10-CM

## 2020-03-29 DIAGNOSIS — M79605 Pain in left leg: Secondary | ICD-10-CM

## 2020-03-29 DIAGNOSIS — M545 Low back pain, unspecified: Secondary | ICD-10-CM

## 2020-03-29 MED ORDER — METFORMIN HCL ER 500 MG PO TB24: 1000.0000 mg | ORAL_TABLET | Freq: Two times a day (BID) | ORAL | 0 refills | Status: DC

## 2020-03-29 NOTE — Telephone Encounter (Signed)
-----   Message from Huston Foley, MD sent at 03/25/2020  5:18 PM EDT ----- Please call patient regarding his lumbar spine MRI and advise him that he has degenerative changes at L5-S1 resulting in disc bulging, and can result in pinched nerve type symptoms.  I would like to refer him to a spine specialist through orthopedics.  If he is agreeable, please place a referral for degenerative spondylosis of the lumbar spine with radiating low back pain to the left leg.

## 2020-03-29 NOTE — Telephone Encounter (Signed)
I called the pt and advised of results. He is agreeable to the orthopedic referral.  I have placed.  Pt advised to call back if he has not heard about referral within the next 7 business days.

## 2020-04-06 ENCOUNTER — Ambulatory Visit: Payer: Medicaid Other | Admitting: Orthopaedic Surgery

## 2020-04-14 ENCOUNTER — Ambulatory Visit: Payer: Medicaid Other | Admitting: Orthopaedic Surgery

## 2020-04-18 ENCOUNTER — Emergency Department (HOSPITAL_COMMUNITY): Payer: Medicaid Other

## 2020-04-18 ENCOUNTER — Other Ambulatory Visit: Payer: Self-pay

## 2020-04-18 ENCOUNTER — Emergency Department (HOSPITAL_COMMUNITY)
Admission: EM | Admit: 2020-04-18 | Discharge: 2020-04-18 | Disposition: A | Discharge status: Home / Self Care | Payer: Medicaid Other | Attending: Emergency Medicine | Admitting: Emergency Medicine

## 2020-04-18 ENCOUNTER — Encounter (HOSPITAL_COMMUNITY): Payer: Self-pay | Admitting: Emergency Medicine

## 2020-04-18 DIAGNOSIS — E119 Type 2 diabetes mellitus without complications: Secondary | ICD-10-CM | POA: Diagnosis not present

## 2020-04-18 DIAGNOSIS — S99912A Unspecified injury of left ankle, initial encounter: Secondary | ICD-10-CM | POA: Diagnosis present

## 2020-04-18 DIAGNOSIS — F17200 Nicotine dependence, unspecified, uncomplicated: Secondary | ICD-10-CM | POA: Diagnosis not present

## 2020-04-18 DIAGNOSIS — J45909 Unspecified asthma, uncomplicated: Secondary | ICD-10-CM | POA: Diagnosis not present

## 2020-04-18 DIAGNOSIS — Z79899 Other long term (current) drug therapy: Secondary | ICD-10-CM | POA: Diagnosis not present

## 2020-04-18 DIAGNOSIS — Z7984 Long term (current) use of oral hypoglycemic drugs: Secondary | ICD-10-CM | POA: Insufficient documentation

## 2020-04-18 DIAGNOSIS — Y9301 Activity, walking, marching and hiking: Secondary | ICD-10-CM | POA: Insufficient documentation

## 2020-04-18 DIAGNOSIS — S93402A Sprain of unspecified ligament of left ankle, initial encounter: Secondary | ICD-10-CM | POA: Diagnosis not present

## 2020-04-18 DIAGNOSIS — I10 Essential (primary) hypertension: Secondary | ICD-10-CM | POA: Diagnosis not present

## 2020-04-18 DIAGNOSIS — W108XXA Fall (on) (from) other stairs and steps, initial encounter: Secondary | ICD-10-CM | POA: Diagnosis not present

## 2020-04-18 DIAGNOSIS — M7989 Other specified soft tissue disorders: Secondary | ICD-10-CM | POA: Diagnosis not present

## 2020-04-18 MED ORDER — NAPROXEN 250 MG PO TABS
375.0000 mg | ORAL_TABLET | Freq: Once | ORAL | Status: AC
  Administered 2020-04-18: 375 mg via ORAL
  Filled 2020-04-18: qty 2

## 2020-04-18 MED ORDER — DICLOFENAC SODIUM 1 % EX GEL
2.0000 g | Freq: Four times a day (QID) | CUTANEOUS | 0 refills | Status: DC
Start: 2020-04-18 — End: 2024-01-10

## 2020-04-18 MED ORDER — ACETAMINOPHEN 500 MG PO TABS
1000.0000 mg | ORAL_TABLET | Freq: Once | ORAL | Status: AC
  Administered 2020-04-18: 1000 mg via ORAL
  Filled 2020-04-18: qty 2

## 2020-04-18 MED ORDER — LIDOCAINE 5 % EX PTCH: 1.0000 | MEDICATED_PATCH | CUTANEOUS | 0 refills | Status: DC

## 2020-04-18 NOTE — ED Notes (Signed)
Pt discharge instructions reviewed with the patient. The patient verbalized understanding. Pt discharged. 

## 2020-04-18 NOTE — Discharge Instructions (Signed)
Use the brace and crutches as needed for the pain.  You can take aleve and tylenol together for the next 3-4 days.  Elevate and ice to help with swelling.  Also you can try lidoderm path and voltaren gel as well for pain.

## 2020-04-18 NOTE — ED Triage Notes (Signed)
Pt states he twisted his L ankle last night.  Reports pain and swelling.  CMS intact.

## 2020-04-18 NOTE — ED Provider Notes (Signed)
[facility] Provider Note   CSN: [id number] Arrival date & time: 04/18/20  1137     History Chief Complaint  Patient presents with  . Ankle Pain    Andre Reynolds is a 39 y.o. male.  Patient is a 39 year old male with a history of obesity, diabetes, hypertension who presents today with pain in the left ankle.  Patient reports he was walking down the steps last night and he missed a step and twisted his left ankle.  It has been painful and swollen since that time.  The pain is worse when he attempts to walk or move his ankle.  He has been able to limp minimally and denies any injury anywhere else.  He has tried rest and elevation but is not helped.  He denies any numbness or tingling of the foot.  The history is provided by the patient.  Ankle Pain Location:  Ankle Injury: yes   Mechanism of injury: fall   Fall:    Fall occurred:  Down stairs   Height of fall:  1 step   Entrapped after fall: yes   Ankle location:  L ankle Pain details:    Quality:  Aching, shooting and throbbing   Radiates to:  Does not radiate   Severity:  Moderate   Onset quality:  Sudden   Timing:  Constant   Progression:  Unchanged Chronicity:  New Relieved by:  Rest Worsened by:  Bearing weight Associated symptoms: decreased ROM and swelling   Associated symptoms: no numbness   Risk factors: obesity        Past Medical History:  Diagnosis Date  . Asthma   . Diabetes mellitus ([place])   . DIVERTICULITIS, HX OF 08/03/2009   Qualifier: Diagnosis of  By: Vanessa Kick MD, Saralyn Pilar    . Hypertension   . Pneumonia     Patient Active Problem List   Diagnosis Date Noted  . Close exposure to COVID-19 virus 05/15/2019  . Need for immunization against influenza 02/12/2018  . Straining with stools 02/12/2018  . Diverticulitis   . Sepsis ([place])   . Microscopic hematuria 07/26/2017  . Type 2 diabetes mellitus ([place]) 07/13/2017  . Essential hypertension 07/04/2017  .  Severe obesity (BMI >= 40) ([place]) 02/19/2017  . Diverticulosis of colon 02/19/2017  . Tobacco use disorder 02/19/2017  . Meralgia paresthetica, left 06/07/2016    Past Surgical History:  Procedure Laterality Date  . WISDOM TOOTH EXTRACTION    . WRIST SURGERY         Family History  Problem Relation Age of Onset  . Diabetes Mother   . Alcohol abuse Mother   . Asthma Mother   . Hypertension Mother   . Breast cancer Mother   . Diabetes Father   . Drug abuse Sister   . Diabetes Maternal Grandmother   . Diabetes Maternal Aunt   . Diabetes Cousin        maternal cousin    Social History   Tobacco Use  . Smoking status: Current Every Day Smoker    Packs/day: 0.50    Years: 20.00    Pack years: 10.00  . Smokeless tobacco: Never Used  Vaping Use  . Vaping Use: Never used  Substance Use Topics  . Alcohol use: Yes    Comment: 2-3 beer socially  . Drug use: Yes    Types: Marijuana    Home Medications Prior to Admission medications   Medication Sig Start Date End Date Taking?  Authorizing Provider  amLODipine (NORVASC) 5 MG tablet TAKE 1 TABLET BY MOUTH EVERY DAY 10/06/19   Bonnita Hollow, MD  atorvastatin (LIPITOR) 10 MG tablet TAKE 1 TABLET BY MOUTH EVERY DAY 10/06/19   Bonnita Hollow, MD  Blood Glucose Monitoring Suppl (ACCU-CHEK COMPACT CARE KIT) KIT Use to check sugar three times a day 07/19/17   Tonette Bihari, MD  glucose blood (ACCU-CHEK COMPACT PLUS) test strip Use to check sugar three times a day 07/13/17   Smiley Houseman, MD  Lancet Devices (LANCING DEVICE) MISC Use to check sugar three times a day 07/16/17   Tonette Bihari, MD  Lancets (FREESTYLE) lancets Use to check sugar three times a day 07/19/17   Tonette Bihari, MD  metFORMIN (GLUCOPHAGE-XR) 500 MG 24 hr tablet Take 2 tablets (1,000 mg total) by mouth 2 (two) times daily. 03/29/20   Gladys Damme, MD  polyethylene glycol powder [facility]) 17 GM/SCOOP powder Take 17 g by mouth 2  (two) times daily as needed. 05/27/19   Bonnita Hollow, MD    Allergies    Patient has no known allergies.  Review of Systems   Review of Systems  All other systems reviewed and are negative.   Physical Exam Updated Vital Signs BP (!) 148/101 (BP Location: Right Arm)   Pulse 99   Temp 98.9 F (37.2 C) (Oral)   Resp 15   Ht $R'5\' 8"'yV$  (1.727 m)   Wt (!) 138.3 kg   SpO2 98%   BMI 46.38 kg/m   Physical Exam Vitals and nursing note reviewed.  Constitutional:      General: He is not in acute distress.    Appearance: Normal appearance. He is obese. He is not ill-appearing.  HENT:     Head: Normocephalic.  Eyes:     Pupils: Pupils are equal, round, and reactive to light.  Cardiovascular:     Rate and Rhythm: Normal rate.     Pulses: Normal pulses.  Pulmonary:     Effort: Pulmonary effort is normal.  Musculoskeletal:        General: Tenderness present.     Left ankle: Swelling present. Tenderness present over the lateral malleolus and base of 5th metatarsal. No medial malleolus or proximal fibula tenderness. Decreased range of motion. Normal pulse.     Left Achilles Tendon: Normal.  Skin:    General: Skin is warm and dry.  Neurological:     Mental Status: He is alert. Mental status is at baseline.  Psychiatric:        Mood and Affect: Mood normal.        Behavior: Behavior normal.        Thought Content: Thought content normal.     ED Results / Procedures / Treatments   Labs (all labs ordered are listed, but only abnormal results are displayed) Labs Reviewed - No data to display  EKG None  Radiology DG Ankle Complete Left  Result Date: 04/18/2020 CLINICAL DATA:  Twisted LEFT ankle last night, pain and swelling, initial encounter EXAM: LEFT ANKLE COMPLETE - 3+ VIEW COMPARISON:  None FINDINGS: Osseous mineralization normal. Joint spaces preserved. Scattered soft tissue swelling. Tiny calcific density adjacent to lateral margin of talus, question tiny avulsion  fragment. No additional fracture, dislocation, or bone destruction. IMPRESSION: Questionable tiny avulsion fracture at lateral margin of talus. No additional osseous abnormalities. Electronically Signed   By: Lavonia Dana M.D.   On: 04/18/2020 13:49    Procedures Procedures (including  critical care time)  Medications Ordered in ED Medications - No data to display  ED Course  I have reviewed the triage vital signs and the nursing notes.  Pertinent labs & imaging results that were available during my care of the patient were reviewed by me and considered in my medical decision making (see chart for details).    MDM Rules/Calculators/A&P                          Patient presenting after mechanical fall with injury to the left ankle.  No other evidence of injury elsewhere.  Neurovascularly intact.  Plain films with questionable tiny avulsion fracture at the lateral margin of the talus but otherwise no additional abnormalities.  Will place patient in an Aircast and give crutches.  Will treat as an ankle sprain.  He can be weightbearing as tolerated.  He was given follow-up with orthopedics.  MDM Number of Diagnoses or Management Options   Amount and/or Complexity of Data Reviewed Tests in the radiology section of CPT: ordered and reviewed Independent visualization of images, tracings, or specimens: yes  Risk of Complications, Morbidity, and/or Mortality Presenting problems: low Diagnostic procedures: minimal Management options: minimal  Patient Progress Patient progress: stable   Final Clinical Impression(s) / ED Diagnoses Final diagnoses:  Sprain of left ankle, unspecified ligament, initial encounter    Rx / DC Orders ED Discharge Orders         Ordered    lidocaine (LIDODERM) 5 %  Every 24 hours        04/18/20 1443    diclofenac Sodium (VOLTAREN) 1 % GEL  4 times daily        04/18/20 1443           Blanchie Dessert, MD 04/18/20 1446

## 2020-04-18 NOTE — Progress Notes (Signed)
Orthopedic Tech Progress Note Patient Details:  [facility] Mar 28, 1981 [id number]  Ortho Devices Type of Ortho Device: Ankle Air splint Ortho Device/Splint Location: LLE Ortho Device/Splint Interventions: Ordered, Application, Adjustment   Post Interventions Patient Tolerated: Well, Ambulated well Instructions Provided: Poper ambulation with device, Care of device   Donald Pore 04/18/2020, 3:01 PM

## 2020-04-19 ENCOUNTER — Telehealth: Payer: Self-pay

## 2020-04-19 NOTE — Telephone Encounter (Signed)
Transition Care Management Follow-up Telephone Call  Date of discharge and from where: 04/18/2020 [facility]  How have you been since you were released from the hospital? Feeling a little better  Any questions or concerns? No  Items Reviewed:  Did the pt receive and understand the discharge instructions provided? No   Medications obtained and verified? No   Other? No   Any new allergies since your discharge? No   Dietary orders reviewed? Yes  Do you have support at home? Yes   Home Care and Equipment/Supplies: Were home health services ordered? no If so, what is the name of the agency? n/a  Has the agency set up a time to come to the patient's home? not applicable Were any new equipment or medical supplies ordered?  No What is the name of the medical supply agency? n/a Were you able to get the supplies/equipment? not applicable Do you have any questions related to the use of the equipment or supplies? No  Functional Questionnaire: (I = Independent and D = Dependent) ADLs: I  Bathing/Dressing- I  Meal Prep- I  Eating- I  Maintaining continence- I  Transferring/Ambulation- I  Managing Meds- I  Follow up appointments reviewed:   PCP Hospital f/u appt confirmed? No, Patient stated he will contact PCP Shirlean Mylar, MD on his own for an appointment.  Specialist Hospital f/u appt confirmed? No  Patient stated he will contact Orthopedic Toni Arthurs, MD on his own for an appointment.   Are transportation arrangements needed? No   If their condition worsens, is the pt aware to call PCP or go to the Emergency Dept.? Yes  Was the patient provided with contact information for the PCP's office or ED? Yes  Was to pt encouraged to call back with questions or concerns? Yes

## 2020-04-22 ENCOUNTER — Ambulatory Visit: Payer: Medicaid Other | Admitting: Surgery

## 2020-04-29 ENCOUNTER — Encounter: Payer: Self-pay | Admitting: Surgery

## 2020-04-29 ENCOUNTER — Ambulatory Visit (INDEPENDENT_AMBULATORY_CARE_PROVIDER_SITE_OTHER): Payer: Medicaid Other | Admitting: Surgery

## 2020-04-29 VITALS — Ht 68.0 in | Wt 305.0 lb

## 2020-04-29 DIAGNOSIS — Z5329 Procedure and treatment not carried out because of patient's decision for other reasons: Secondary | ICD-10-CM

## 2020-04-30 ENCOUNTER — Ambulatory Visit: Payer: Medicaid Other | Admitting: Orthopaedic Surgery

## 2020-05-11 ENCOUNTER — Encounter: Payer: Self-pay | Admitting: Orthopaedic Surgery

## 2020-05-11 ENCOUNTER — Ambulatory Visit (INDEPENDENT_AMBULATORY_CARE_PROVIDER_SITE_OTHER): Payer: Medicaid Other

## 2020-05-11 ENCOUNTER — Ambulatory Visit (INDEPENDENT_AMBULATORY_CARE_PROVIDER_SITE_OTHER): Payer: Medicaid Other | Admitting: Orthopaedic Surgery

## 2020-05-11 ENCOUNTER — Other Ambulatory Visit: Payer: Self-pay

## 2020-05-11 VITALS — Ht 68.0 in | Wt 305.0 lb

## 2020-05-11 DIAGNOSIS — G8929 Other chronic pain: Secondary | ICD-10-CM

## 2020-05-11 DIAGNOSIS — M4316 Spondylolisthesis, lumbar region: Secondary | ICD-10-CM | POA: Diagnosis not present

## 2020-05-11 DIAGNOSIS — M545 Low back pain, unspecified: Secondary | ICD-10-CM

## 2020-05-11 NOTE — Progress Notes (Signed)
Office Visit Note   Patient: Andre Reynolds           Date of Birth: 11-01-1980           MRN: [id number] Visit Date: 05/11/2020              Requested by: Huston Foley, MD [address] [place],  [place] [address] PCP: Shirlean Mylar, MD   Assessment & Plan: Visit Diagnoses:  1. Chronic bilateral low back pain, unspecified whether sciatica present   2. Spondylolisthesis, lumbar region     Plan: We reviewed MRI images and plain radiographs.  He needs to lose 42 pounds to get his BMI below 40.  He understands that with his severe L5 foraminal stenosis he may ultimately require lumbar fusion.  We will set him up for some physical therapy for treatment of spondylolisthesis and L5 by foraminal stenosis right greater than left.  Recheck 6 weeks.  Follow-Up Instructions: Return in about 6 weeks (around 06/22/2020).   Orders:  Orders Placed This Encounter  Procedures  . XR Lumbar Spine 2-3 Views  . Ambulatory referral to Physical Therapy   No orders of the defined types were placed in this encounter.     Procedures: No procedures performed   Clinical Data: No additional findings.   Subjective: Chief Complaint  Patient presents with  . Lower Back - Pain    HPI 39 year old patient seen with chronic low back pain is already had a previous MRI and is a new patient to our practice.  Patient's been using Advil without any relief.  Pains in the lower back midline sometimes worse on the right than left side pain goes into both legs.  Sometimes in the thigh really to the toes.  Sometimes numbness and tingling in the toes.  Patient's had anterolisthesis at L5-S1 10 mm noted for more than 2 years.  Patient has increased BMI weight 305 and states he actually used to weigh almost 400 pounds and is continued to be able to slowly lose weight.  Review of Systems positive for smoking, morbid obesity.  Some problems with diverticulosis in the past .possible type 2 diabetes  on insulin.  He relates his insulin dose is decreased as he is lost weight.  All other systems are negative to HPI.   Objective: Vital Signs: Ht 5\' 8"  (1.727 m)   Wt (!) 305 lb (138.3 kg)   BMI 46.38 kg/m   Physical Exam Constitutional:      Appearance: He is well-developed.  HENT:     Head: Normocephalic and atraumatic.  Eyes:     Pupils: Pupils are equal, round, and reactive to light.  Neck:     Thyroid: No thyromegaly.     Trachea: No tracheal deviation.  Cardiovascular:     Rate and Rhythm: Normal rate.  Pulmonary:     Effort: Pulmonary effort is normal.     Breath sounds: No wheezing.  Abdominal:     General: Bowel sounds are normal.     Palpations: Abdomen is soft.  Skin:    General: Skin is warm and dry.     Capillary Refill: Capillary refill takes less than 2 seconds.  Neurological:     Mental Status: He is alert and oriented to person, place, and time.  Psychiatric:        Behavior: Behavior normal.        Thought Content: Thought content normal.        Judgment: Judgment normal.  Ortho Exam patient has pain with straight leg raising right and left at 80 degrees.  Sciatic notch tenderness right and left.  Knee and ankle jerk are intact.  He is able to heel and toe walk.  Specialty Comments:  No specialty comments available.  Imaging: XR Lumbar Spine 2-3 Views  Result Date: 05/12/2020 AP lateral lumbar spine x-rays are obtained and reviewed.  This shows 10 mm anterolisthesis with significant disc base narrowing L5-S1.  All other levels disc height are maintained. Impression: Grade 1.5 anterolisthesis L5-S1 with degenerative facets.    PMFS History: Patient Active Problem List   Diagnosis Date Noted  . Spondylolisthesis, lumbar region 05/12/2020  . Close exposure to COVID-19 virus 05/15/2019  . Need for immunization against influenza 02/12/2018  . Straining with stools 02/12/2018  . Diverticulitis   . Sepsis (HCC)   . Microscopic hematuria  07/26/2017  . Type 2 diabetes mellitus (HCC) 07/13/2017  . Essential hypertension 07/04/2017  . Severe obesity (BMI >= 40) (HCC) 02/19/2017  . Diverticulosis of colon 02/19/2017  . Tobacco use disorder 02/19/2017  . Meralgia paresthetica, left 06/07/2016   Past Medical History:  Diagnosis Date  . Asthma   . Diabetes mellitus (HCC)   . DIVERTICULITIS, HX OF 08/03/2009   Qualifier: Diagnosis of  By: Gilford Rile MD, Luisa Hart    . Hypertension   . Pneumonia     Family History  Problem Relation Age of Onset  . Diabetes Mother   . Alcohol abuse Mother   . Asthma Mother   . Hypertension Mother   . Breast cancer Mother   . Diabetes Father   . Drug abuse Sister   . Diabetes Maternal Grandmother   . Diabetes Maternal Aunt   . Diabetes Cousin        maternal cousin    Past Surgical History:  Procedure Laterality Date  . WISDOM TOOTH EXTRACTION    . WRIST SURGERY     Social History   Occupational History  . Occupation: not working  Tobacco Use  . Smoking status: Current Every Day Smoker    Packs/day: 0.50    Years: 20.00    Pack years: 10.00  . Smokeless tobacco: Never Used  Vaping Use  . Vaping Use: Never used  Substance and Sexual Activity  . Alcohol use: Yes    Comment: 2-3 beer socially  . Drug use: Yes    Types: Marijuana  . Sexual activity: Yes    Partners: Female

## 2020-05-12 DIAGNOSIS — M4316 Spondylolisthesis, lumbar region: Secondary | ICD-10-CM | POA: Insufficient documentation

## 2020-05-27 ENCOUNTER — Ambulatory Visit: Payer: Medicaid Other | Attending: Orthopaedic Surgery

## 2020-05-27 ENCOUNTER — Other Ambulatory Visit: Payer: Self-pay

## 2020-05-27 DIAGNOSIS — R293 Abnormal posture: Secondary | ICD-10-CM | POA: Insufficient documentation

## 2020-05-27 DIAGNOSIS — G8929 Other chronic pain: Secondary | ICD-10-CM | POA: Insufficient documentation

## 2020-05-27 DIAGNOSIS — M545 Low back pain, unspecified: Secondary | ICD-10-CM | POA: Insufficient documentation

## 2020-05-27 DIAGNOSIS — M6281 Muscle weakness (generalized): Secondary | ICD-10-CM | POA: Diagnosis not present

## 2020-05-28 NOTE — Therapy (Addendum)
[place], [place], [address] Phone: [phone]   Fax:  [phone]  Physical Therapy Evaluation/Discharge  Patient Details  Name: Andre Reynolds MRN: [id number] Date of Birth: Feb 03, 1981 Referring Provider (PT): Marybelle Killings, MD   Encounter Date: 05/27/2020   PT End of Session - 05/28/20 0622    Visit Number 1    Number of Visits 7    Date for PT Re-Evaluation 07/17/20    Authorization Type [place] MEDICAID HEALTHY BLUE    Progress Note Due on Visit 10    Equipment Utilized During Treatment Gait belt    Activity Tolerance Patient tolerated treatment well    Behavior During Therapy [facility] for tasks assessed/performed           Past Medical History:  Diagnosis Date  . Asthma   . Diabetes mellitus ([place])   . DIVERTICULITIS, HX OF 08/03/2009   Qualifier: Diagnosis of  By: Vanessa Kick MD, Saralyn Pilar    . Hypertension   . Pneumonia     Past Surgical History:  Procedure Laterality Date  . WISDOM TOOTH EXTRACTION    . WRIST SURGERY      There were no vitals filed for this visit.    Subjective Assessment - 05/27/20 1228    Subjective Pt reports a 10 year hx of low back pain which has become wrose over the last 2 years.    Diagnostic tests 05/12/20: Impression: Grade 1.5 anterolisthesis L5-S1 with degenerative facets.    Patient Stated Goals To reduce the pain. To manage without surgery.    Currently in Pain? Yes    Pain Score 7     Pain Location Back    Pain Orientation Right    Pain Descriptors / Indicators Aching;Sharp;Throbbing    Pain Type Chronic pain    Pain Radiating Towards L lalteral LE c associated N/T    Pain Onset More than a month ago    Pain Frequency Constant    Aggravating Factors  Prolonged standing, walking    Pain Relieving Factors Sitting, rest    Effect of Pain on Daily Activities Significant impact              [facility] PT Assessment - 05/28/20 0001      Assessment   Medical  Diagnosis Chronic bilateral low back pain, unspecified whether sciatica present    Referring Provider (PT) Marybelle Killings, MD    Onset Date/Surgical Date --   10 years ago, wrose in the pasr 2 tears   Hand Dominance Right    Next MD Visit 06/22/20    Prior Therapy Yes   1 years ago     Precautions   Precautions None      Restrictions   Weight Bearing Restrictions No      Balance Screen   Has the patient fallen in the past 6 months No      [place] residence    Living Arrangements Spouse/significant other;Children    Type of [place] Access Level entry      Prior Function   Level of Independence Independent    Vocation Unemployed      Cognition   Overall Cognitive Status Within Functional Limits for tasks assessed      Observation/Other Assessments   Focus on Therapeutic Outcomes (FOTO)  NA      Sensation   Light Touch Appears Intact  Posture/Postural Control   Posture/Postural Control Postural limitations    Postural Limitations Forward head;Increased lumbar lordosis;Decreased thoracic kyphosis   Lumbsacral step off     ROM / Strength   AROM / PROM / Strength AROM;Strength      AROM   Overall AROM Comments Trunk AROMs are WFLs with ext. being limited by 25%. Pain was reporduced c return from flexion and ext. pt reports muscle tightness/pulling with LSB and LR.      Strength   Overall Strength Comments WNLs      Special Tests   Other special tests low back pain was reproduced with the [facility] test      Transfers   Transfers Sit to Stand;Stand to Sit    Sit to Stand 7: Independent      Ambulation/Gait   Ambulation/Gait Yes    Ambulation/Gait Assistance 7: Independent    Gait Pattern Within Functional Limits;Step-to pattern                      Objective measurements completed on examination: See above findings.               PT Education - 05/28/20 0621    Education Details Eval  findings, POC, HEP, neutral spine sitting posture, positioning/support for decreased pain with sleeping    Person(s) Educated Patient    Methods Explanation;Demonstration;Tactile cues;Verbal cues;Handout    Comprehension Verbalized understanding;Returned demonstration;Verbal cues required;Tactile cues required;Need further instruction            PT Short Term Goals - 05/28/20 6237      PT SHORT TERM GOAL #1   Title Pt iwill be ind in an initial HEP.    Baseline Started on Eval    Status New    Target Date 06/18/20      PT SHORT TERM GOAL #2   Title Pt wil voice understanding of measures to assit in the reduction of his low back/L LE pain    Status New    Target Date 06/18/20             PT Long Term Goals - 05/28/20 0636      PT LONG TERM GOAL #1   Title Pt will be Ind in a final HEP to maintain or progress achieved LOF    Target Date 07/17/20      PT LONG TERM GOAL #2   Title Pt will be able return demostration of proper body mechanics for home related activities    Status New    Target Date 07/17/20      PT LONG TERM GOAL #3   Title Pt will report consistent improvement in low back pain to 4/10 or less with daily activities    Status New    Target Date 07/17/20                  Plan - 05/28/20 6283    Clinical Impression Statement Pt presents with low back and intermittent L LE pain and numbess consistent with his x-rays for Grade 1.5 anterolisthesis L5-S1 with degenerative facets. Pt was started on a HEP for flexibility for the hip flexors and abdominal/back strengthening exs for low back stability. Pt returned demonstration and a written HEP was provided. Pt will benefit from PT 1w6 to decrease and increase strength/stability of the trunk to maximize functional mobility.    Personal Factors and Comorbidities Comorbidity 1;Comorbidity 2    Comorbidities MD, HTN, obesity    Examination-Activity Limitations  Squat;Stand;Locomotion Level;Lift;Carry     Examination-Participation Restrictions Other   Daily activities   Stability/Clinical Decision Making Stable/Uncomplicated    Clinical Decision Making Low    Rehab Potential Good    PT Frequency 1x / week    PT Duration 6 weeks    PT Treatment/Interventions ADLs/Self Care Home Management;Cryotherapy;Electrical Stimulation;Iontophoresis 4mg /ml Dexamethasone;Moist Heat;Traction;Ultrasound;Therapeutic exercise;Therapeutic activities;Patient/family education;Manual techniques;Dry needling;Taping    PT Next Visit Plan Assess reponse to HEP. Progress ther ex as indicated.    PT Home Exercise Plan [email or web address]    Consulted and Agree with Plan of Care Patient           Patient will benefit from skilled therapeutic intervention in order to improve the following deficits and impairments:  Obesity,Pain,Postural dysfunction,Decreased strength,Difficulty walking,Decreased activity tolerance  Visit Diagnosis: Chronic bilateral low back pain, unspecified whether sciatica present  Abnormal posture  Muscle weakness (generalized)     Problem List Patient Active Problem List   Diagnosis Date Noted  . Spondylolisthesis, lumbar region 05/12/2020  . Close exposure to COVID-19 virus 05/15/2019  . Need for immunization against influenza 02/12/2018  . Straining with stools 02/12/2018  . Diverticulitis   . Sepsis ([place])   . Microscopic hematuria 07/26/2017  . Type 2 diabetes mellitus ([place]) 07/13/2017  . Essential hypertension 07/04/2017  . Severe obesity (BMI >= 40) ([place]) 02/19/2017  . Diverticulosis of colon 02/19/2017  . Tobacco use disorder 02/19/2017  . Meralgia paresthetica, left 06/07/2016    Gar Ponto MS, PT 05/28/20 6:47 AM  [facility] [address] [place] [address] [place], [place], [address] Phone: [phone]   Fax:  [phone]  Name: Andre Reynolds MRN: [id number] Date of Birth: 1981-04-24   PHYSICAL THERAPY DISCHARGE SUMMARY  Visits from  Start of Care: 1, eval only  Current functional level related to goals / functional outcomes: See above   Remaining deficits: Unknown, pt did not return to PT after the eval   Education / Equipment: HEP  Plan: Patient agrees to discharge.  Patient goals were not met. Patient is being discharged due to not returning since the last visit.  ?????        Jorel Gravlin MS, PT 08/18/20 3:13 PM

## 2020-06-17 ENCOUNTER — Ambulatory Visit: Payer: Medicaid Other

## 2020-06-22 ENCOUNTER — Encounter: Payer: Self-pay | Admitting: Orthopaedic Surgery

## 2020-06-22 ENCOUNTER — Other Ambulatory Visit: Payer: Self-pay

## 2020-06-22 ENCOUNTER — Ambulatory Visit (INDEPENDENT_AMBULATORY_CARE_PROVIDER_SITE_OTHER): Payer: Medicaid Other | Admitting: Orthopaedic Surgery

## 2020-06-22 VITALS — Ht 68.0 in | Wt 312.0 lb

## 2020-06-22 DIAGNOSIS — M545 Low back pain, unspecified: Secondary | ICD-10-CM | POA: Diagnosis not present

## 2020-06-22 DIAGNOSIS — M4316 Spondylolisthesis, lumbar region: Secondary | ICD-10-CM | POA: Diagnosis not present

## 2020-06-22 DIAGNOSIS — G8929 Other chronic pain: Secondary | ICD-10-CM

## 2020-06-22 DIAGNOSIS — M7061 Trochanteric bursitis, right hip: Secondary | ICD-10-CM | POA: Diagnosis not present

## 2020-06-22 NOTE — Progress Notes (Signed)
Office Visit Note   Patient: Andre Reynolds           Date of Birth: 01/10/1981           MRN: [id number] Visit Date: 06/22/2020              Requested by: Shirlean Mylar, MD [phone] [address] [place],  [place] [address] PCP: Shirlean Mylar, MD   Assessment & Plan: Visit Diagnoses:  1. Spondylolisthesis, lumbar region   2. Chronic bilateral low back pain, unspecified whether sciatica present   3. Greater trochanteric bursitis of right hip     Plan: Since patient has only had 1 normal PT visit recommended he continue to see how he does.  We will have therapist add appropriate IT band stretching exercises for new diagnosis of right hip greater trochanteric bursitis and patient will also do this at home I did show him a couple today.  I did discuss conservative treatment today with right hip there trochanter bursa Marcaine/steroid injection but we will wait to see how he does with PT.  Follow-up with me in 3 weeks for recheck of his lumbar spine and right lateral hip.  We will consider injection at that time depending on how he is doing.  Follow-Up Instructions: Return in about 3 weeks (around 07/13/2020) for With Fayrene Fearing for recheck lumbar and also possible right hip greater trochanter bursa injection.   Orders:  Orders Placed This Encounter  Procedures  . Ambulatory referral to Physical Therapy   No orders of the defined types were placed in this encounter.     Procedures: No procedures performed   Clinical Data: No additional findings.   Subjective: Chief Complaint  Patient presents with  . Lower Back - Pain, Follow-up    HPI 40 year old black male with history of L5-S1 foraminal stenosis and spinal listhesis returns for recheck of his low back pain and right leg pain.  States his symptoms are unchanged from previous visit.  He has only had 1 formal PT visit.  Patient localizes most of his pain to the right lateral hip and thigh.  It is worse when he is laying on his  right side and also when he is up ambulating.  No complaints of groin pain.   Objective: Vital Signs: Ht 5\' 8"  (1.727 m)   Wt (!) 312 lb (141.5 kg)   BMI 47.44 kg/m   Physical Exam Pleasant male alert and oriented in no acute distress.  Patient has a slight Trendelenburg gait.  He has much more tenderness over the right hip greater trochanter bursa and this extends midway down the IT band. Ortho Exam  Specialty Comments:  No specialty comments available.  Imaging: No results found.   PMFS History: Patient Active Problem List   Diagnosis Date Noted  . Spondylolisthesis, lumbar region 05/12/2020  . Close exposure to COVID-19 virus 05/15/2019  . Need for immunization against influenza 02/12/2018  . Straining with stools 02/12/2018  . Diverticulitis   . Sepsis (HCC)   . Microscopic hematuria 07/26/2017  . Type 2 diabetes mellitus (HCC) 07/13/2017  . Essential hypertension 07/04/2017  . Severe obesity (BMI >= 40) (HCC) 02/19/2017  . Diverticulosis of colon 02/19/2017  . Tobacco use disorder 02/19/2017  . Meralgia paresthetica, left 06/07/2016   Past Medical History:  Diagnosis Date  . Asthma   . Diabetes mellitus (HCC)   . DIVERTICULITIS, HX OF 08/03/2009   Qualifier: Diagnosis of  By: 08/05/2009 MD, Gilford Rile    .  Hypertension   . Pneumonia     Family History  Problem Relation Age of Onset  . Diabetes Mother   . Alcohol abuse Mother   . Asthma Mother   . Hypertension Mother   . Breast cancer Mother   . Diabetes Father   . Drug abuse Sister   . Diabetes Maternal Grandmother   . Diabetes Maternal Aunt   . Diabetes Cousin        maternal cousin    Past Surgical History:  Procedure Laterality Date  . WISDOM TOOTH EXTRACTION    . WRIST SURGERY     Social History   Occupational History  . Occupation: not working  Tobacco Use  . Smoking status: Current Every Day Smoker    Packs/day: 0.50    Years: 20.00    Pack years: 10.00  . Smokeless tobacco: Never Used   Vaping Use  . Vaping Use: Never used  Substance and Sexual Activity  . Alcohol use: Yes    Comment: 2-3 beer socially  . Drug use: Yes    Types: Marijuana  . Sexual activity: Yes    Partners: Female

## 2020-06-24 ENCOUNTER — Ambulatory Visit: Payer: Medicaid Other | Admitting: Physical Therapy

## 2020-07-01 ENCOUNTER — Ambulatory Visit: Payer: Medicaid Other

## 2020-07-01 ENCOUNTER — Encounter: Payer: Medicaid Other | Admitting: Family Medicine

## 2020-07-08 ENCOUNTER — Ambulatory Visit: Payer: Medicaid Other

## 2020-07-14 ENCOUNTER — Ambulatory Visit: Payer: Medicaid Other | Admitting: Surgery

## 2020-07-15 ENCOUNTER — Telehealth: Payer: Self-pay | Admitting: Surgery

## 2020-07-15 ENCOUNTER — Ambulatory Visit: Payer: Medicaid Other | Admitting: Surgery

## 2020-07-15 ENCOUNTER — Telehealth: Payer: Self-pay

## 2020-07-15 ENCOUNTER — Ambulatory Visit: Payer: Medicaid Other | Admitting: Physical Therapy

## 2020-07-15 NOTE — Telephone Encounter (Signed)
lvm for pt to call back. Per Fayrene Fearing he would like pt to come in sooner than 4:15 if possible. If he can be here by 1:30 per Fayrene Fearing that would be best.

## 2020-07-15 NOTE — Telephone Encounter (Signed)
Patient wife changed appt til 2/10 at 9 am

## 2020-07-22 ENCOUNTER — Ambulatory Visit: Payer: Medicaid Other | Admitting: Surgery

## 2020-07-29 ENCOUNTER — Ambulatory Visit: Payer: Medicaid Other | Admitting: Surgery

## 2020-07-29 ENCOUNTER — Ambulatory Visit: Payer: Medicaid Other | Admitting: Physical Therapy

## 2020-08-05 ENCOUNTER — Ambulatory Visit: Payer: Medicaid Other | Admitting: Surgery

## 2020-08-12 ENCOUNTER — Ambulatory Visit: Payer: Medicaid Other | Admitting: Surgery

## 2020-08-19 ENCOUNTER — Ambulatory Visit: Payer: Medicaid Other | Attending: Orthopaedic Surgery

## 2020-08-26 ENCOUNTER — Ambulatory Visit (INDEPENDENT_AMBULATORY_CARE_PROVIDER_SITE_OTHER): Payer: Medicaid Other | Admitting: Surgery

## 2020-08-26 ENCOUNTER — Other Ambulatory Visit: Payer: Self-pay

## 2020-08-26 ENCOUNTER — Encounter: Payer: Self-pay | Admitting: Surgery

## 2020-08-26 VITALS — Ht 68.0 in | Wt 312.0 lb

## 2020-08-26 DIAGNOSIS — M7061 Trochanteric bursitis, right hip: Secondary | ICD-10-CM

## 2020-08-26 DIAGNOSIS — M4316 Spondylolisthesis, lumbar region: Secondary | ICD-10-CM | POA: Diagnosis not present

## 2020-08-26 NOTE — Progress Notes (Signed)
Office Visit Note   Patient: Andre Reynolds           Date of Birth: 09-Feb-1981           MRN: [id number] Visit Date: 08/26/2020              Requested by: Shirlean Mylar, MD [phone] [address] [place],  [place] [address] PCP: Shirlean Mylar, MD   Assessment & Plan: Visit Diagnoses:  1. Spondylolisthesis, lumbar region   2. Greater trochanteric bursitis of right hip     Plan: Today I checked fingerstick glucose in clinic and this was 225.  Advised patient that with this number being so high I do not want to perform steroid injection due to the potential for this to increase the number.  He will follow-up with Dr. Ophelia Charter in a few weeks for recheck.  I did advise patient that if he works on his blood sugars and gets them down over the next couple of weeks that he can schedule a sooner appointment with me and then I will perform greater trochanter bursa injection at that time.  He voices understanding.  In the meantime he will continue IT band stretching program he can use ice off and on the right lateral hip as needed.  Follow-Up Instructions: Return in about 6 weeks (around 10/07/2020) for With Dr. Ophelia Charter for recheck lumbar spine and right hip.   Orders:  No orders of the defined types were placed in this encounter.  No orders of the defined types were placed in this encounter.     Procedures: No procedures performed   Clinical Data: No additional findings.   Subjective: Chief Complaint  Patient presents with  . Right Hip - Pain  . Lower Back - Pain    HPI 40 year old black male returns for recheck of his lumbar stenosis and right hip greater trochanteric bursitis.  States that he has not had any improvement with therapy.  He continues to complain of ongoing pain in the right buttock and right lateral hip.  States that lateral hip pain is currently worse.  Aggravated with ambulation and laying on his side.  He is want to proceed with greater trochanter bursa injection that  we discussed last office visit.  Patient does have a history of type 2 diabetes and is currently taking Metformin.  Did not check blood sugar today. Review of Systems No current cardiac pulmonary GI GU  Objective: Vital Signs: Ht 5\' 8"  (1.727 m)   Wt (!) 312 lb (141.5 kg)   BMI 47.44 kg/m   Physical Exam Eyes:     Extraocular Movements: Extraocular movements intact.  Pulmonary:     Effort: No respiratory distress.  Musculoskeletal:     Comments: Gait is antalgic.  Patient has marked tenderness over the right hip greater trochanter bursa.  Tenderness extends midway down the IT band.  Mild to moderate right sciatic notch tenderness.  Neurological:     Mental Status: He is oriented to person, place, and time.  Psychiatric:        Mood and Affect: Mood normal.     Ortho Exam  Specialty Comments:  No specialty comments available.  Imaging: No results found.   PMFS History: Patient Active Problem List   Diagnosis Date Noted  . Spondylolisthesis, lumbar region 05/12/2020  . Close exposure to COVID-19 virus 05/15/2019  . Need for immunization against influenza 02/12/2018  . Straining with stools 02/12/2018  . Diverticulitis   . Sepsis (HCC)   .  Microscopic hematuria 07/26/2017  . Type 2 diabetes mellitus (HCC) 07/13/2017  . Essential hypertension 07/04/2017  . Severe obesity (BMI >= 40) (HCC) 02/19/2017  . Diverticulosis of colon 02/19/2017  . Tobacco use disorder 02/19/2017  . Meralgia paresthetica, left 06/07/2016   Past Medical History:  Diagnosis Date  . Asthma   . Diabetes mellitus (HCC)   . DIVERTICULITIS, HX OF 08/03/2009   Qualifier: Diagnosis of  By: Gilford Rile MD, Luisa Hart    . Hypertension   . Pneumonia     Family History  Problem Relation Age of Onset  . Diabetes Mother   . Alcohol abuse Mother   . Asthma Mother   . Hypertension Mother   . Breast cancer Mother   . Diabetes Father   . Drug abuse Sister   . Diabetes Maternal Grandmother   . Diabetes  Maternal Aunt   . Diabetes Cousin        maternal cousin    Past Surgical History:  Procedure Laterality Date  . WISDOM TOOTH EXTRACTION    . WRIST SURGERY     Social History   Occupational History  . Occupation: not working  Tobacco Use  . Smoking status: Current Every Day Smoker    Packs/day: 0.50    Years: 20.00    Pack years: 10.00  . Smokeless tobacco: Never Used  Vaping Use  . Vaping Use: Never used  Substance and Sexual Activity  . Alcohol use: Yes    Comment: 2-3 beer socially  . Drug use: Yes    Types: Marijuana  . Sexual activity: Yes    Partners: Female

## 2020-09-01 ENCOUNTER — Other Ambulatory Visit: Payer: Self-pay

## 2020-09-01 ENCOUNTER — Ambulatory Visit: Payer: Medicaid Other | Admitting: Family Medicine

## 2020-09-01 VITALS — BP 156/90 | HR 76 | Ht 68.0 in | Wt 301.6 lb

## 2020-09-01 DIAGNOSIS — I10 Essential (primary) hypertension: Secondary | ICD-10-CM

## 2020-09-01 DIAGNOSIS — E119 Type 2 diabetes mellitus without complications: Secondary | ICD-10-CM | POA: Diagnosis not present

## 2020-09-01 DIAGNOSIS — E785 Hyperlipidemia, unspecified: Secondary | ICD-10-CM

## 2020-09-01 DIAGNOSIS — E1169 Type 2 diabetes mellitus with other specified complication: Secondary | ICD-10-CM | POA: Diagnosis not present

## 2020-09-01 DIAGNOSIS — Z1159 Encounter for screening for other viral diseases: Secondary | ICD-10-CM

## 2020-09-01 LAB — POCT UA - MICROALBUMIN
Creatinine, POC: 130 mg/dL
Microalbumin Ur, POC: 150 mg/L

## 2020-09-01 LAB — POCT GLYCOSYLATED HEMOGLOBIN (HGB A1C): HbA1c, POC (controlled diabetic range): 8.1 % — AB (ref 0.0–7.0)

## 2020-09-01 MED ORDER — METFORMIN HCL ER 500 MG PO TB24: 1000.0000 mg | ORAL_TABLET | Freq: Two times a day (BID) | ORAL | 0 refills | Status: DC

## 2020-09-01 MED ORDER — ATORVASTATIN CALCIUM 40 MG PO TABS: 40.0000 mg | ORAL_TABLET | Freq: Every day | ORAL | 3 refills | Status: DC

## 2020-09-01 MED ORDER — ACCU-CHEK COMPACT PLUS VI STRP: ORAL_STRIP | 12 refills | Status: DC

## 2020-09-01 MED ORDER — AMLODIPINE BESYLATE 10 MG PO TABS: 10.0000 mg | ORAL_TABLET | Freq: Every day | ORAL | 3 refills | Status: DC

## 2020-09-01 MED ORDER — FREESTYLE LANCETS MISC: 12 refills | Status: DC

## 2020-09-01 MED ORDER — LANCING DEVICE MISC: 0 refills | Status: AC

## 2020-09-01 NOTE — Assessment & Plan Note (Signed)
Chronic. Noncompliant with statin. Would benefit from moderate to high intensity statin given current tobacco use, HTN, and T2DM. - start Atorvastatin 40mg  QD - lipid panel today for continued monitoring. Consider repeat in 3 months to evaluate for compliance and improvement

## 2020-09-01 NOTE — Patient Instructions (Signed)
It was a pleasure to see you today!  Thank you for choosing [facility] for your primary care.   Our plans for today were:  Blood pressure: I am increasing your Amlodipine to 10mg  daily   Diabetes: Please restart your Metformin 1000mg  twice a day  Follow up with your family doctor, Dr. , in 3 months for diabetes follow up  I have called in refills for your diabetes strips  Cholesterol: Please start taking Atorvastatin 40mg  daily   To keep you healthy, please keep in mind the following health maintenance items that you are due for:   1. Diabetic eye exam    We are checking some labs today, I will call you if they are abnormal will send you a MyChart message or a letter if they are normal.  If you do not hear about your labs in the next 2 weeks please let know.  BRING ALL OF YOUR MEDICATIONS WITH YOU TO EVERY VISIT    You should return to our clinic in 3 months for diabetes follow up .   Best Wishes,   Leary Roca, DO

## 2020-09-01 NOTE — Assessment & Plan Note (Signed)
Chronic. Uncontrolled. Goal <8. Has not been compliant with Metformin. - restart Metformin 1000mg  BID - microalbuminuria noted today. Would benefit from ACE/ARB - BMP today to monitor kidney function  - diabetic foot exam completed - recommended to follow up with eye doctor for diabetic eye exam - encouraged smoking cessation - follow up with PCP in 3 months for continued monitoring. If elevated, consider addition of SGLT-2 or GLP-1

## 2020-09-01 NOTE — Progress Notes (Signed)
Subjective:   Patient ID: Andre Reynolds    DOB: September 23, 1980, 40 y.o. male   MRN: [id number]  Andre Reynolds is a 40 y.o. male with a history of HTN, T2DM, obesity, tobacco abuse, diverticulosis here for diabetes and med refill  Diabetes: Last three A1C's below. Supposed to be on Metformin 1000mg  BID but has not been taking it for several months. Notes CBGs range 210's. Denies any hypoglycemia.  Due for diabetic foot exam and eye exam  Lab Results  Component Value Date   HGBA1C 8.1 (A) 09/01/2020   HGBA1C 7.0 (H) 02/01/2018   HGBA1C 7.5 10/04/2017    HTN:  BP: (!) 156/90 today. Recheck by Dr. 10/06/2017: 145/104. Currently on Amlodipine 5mg  QD. Did not take it this morning. Current everyday smoker. Denies any chest pain, SOB, vision changes, or headaches.   Lab Results  Component Value Date   CREATININE 1.01 02/02/2018   CREATININE 1.17 02/01/2018   CREATININE 1.08 01/31/2018    HLD: Last lipid panel below. Supposed to be on Atorvastatin 10mg  QD but has been out of it for months.   Lab Results  Component Value Date   CHOL 160 07/26/2017   HDL 39 (L) 07/26/2017   LDLCALC 87 07/26/2017   TRIG 171 (H) 07/26/2017   CHOLHDL 4.1 07/26/2017   Health Maintenance: Health Maintenance Due  Topic  . Hepatitis C Screening   . OPHTHALMOLOGY EXAM    No family history of prostate or colon cancer to suggest early screening.  Review of Systems:  Per HPI.   Objective:   BP (!) 156/90   Pulse 76   Ht 5\' 8"  (1.727 m)   Wt (!) 301 lb 9.6 oz (136.8 kg)   SpO2 98%   BMI 45.86 kg/m  Vitals and nursing note reviewed.  General: pleasant older gentleman, sitting comfortably in exam chair, well nourished, well developed, in no acute distress with non-toxic appearance CV: regular rate and rhythm without murmurs, rubs, or gallops, no lower extremity edema, 2+ radial and pedal pulses bilaterally Lungs: clear to auscultation bilaterally with normal work of breathing on room air, speaking  in full sentences Skin: warm, dry Extremities: warm and well perfused Neuro: Alert and oriented, speech normal  Diabetic foot exam was performed with the following findings:   No deformities, ulcerations, or other skin breakdown Intact posterior tibialis and dorsalis pedis pulses Decreased sensation along toes on right, chronic per patient Normal on left     Assessment & Plan:   Type 2 diabetes mellitus (HCC) Chronic. Uncontrolled. Goal <8. Has not been compliant with Metformin. - restart Metformin 1000mg  BID - microalbuminuria noted today. Would benefit from ACE/ARB - BMP today to monitor kidney function  - diabetic foot exam completed - recommended to follow up with eye doctor for diabetic eye exam - encouraged smoking cessation - follow up with PCP in 3 months for continued monitoring. If elevated, consider addition of SGLT-2 or GLP-1   Essential hypertension Chronic, uncontrolled. Multiple elevated BP's today in setting of not taking home BP meds. Would benefit from increase in BP medicine.  - increased Amlodipine to 10mg  QD - BP follow up in 2 weeks - consider at that time addition of low dose ACE/ARB if still elevated or if BP allows given microalbuminuria and T2DM - BMP today to monitor kidney function - follow up with PCP  Hyperlipidemia associated with type 2 diabetes mellitus (HCC) Chronic. Noncompliant with statin. Would benefit from moderate to high intensity statin  given current tobacco use, HTN, and T2DM. - start Atorvastatin 40mg  QD - lipid panel today for continued monitoring. Consider repeat in 3 months to evaluate for compliance and improvement   Health Maintenance: Declined COVID and flu  Orders Placed This Encounter  Procedures  . Basic Metabolic Panel  . Lipid Panel  . Hepatitis C antibody  . POCT glycosylated hemoglobin (Hb A1C)  . POCT UA - Microalbumin   Meds ordered this encounter  Medications  . metFORMIN (GLUCOPHAGE-XR) 500 MG 24 hr  tablet    Sig: Take 2 tablets (1,000 mg total) by mouth 2 (two) times daily.    Dispense:  120 tablet    Refill:  0    DX Code Needed  .  Lancets (FREESTYLE) lancets    Sig: Use to check sugar three times a day    Dispense:  100 each    Refill:  12  . Lancet Devices (LANCING DEVICE) MISC    Sig: Use to check sugar three times a day    Dispense:  1 each    Refill:  0  . glucose blood (ACCU-CHEK COMPACT PLUS) test strip    Sig: Use to check sugar three times a day    Dispense:  100 each    Refill:  12  . amLODipine (NORVASC) 10 MG tablet    Sig: Take 1 tablet (10 mg total) by mouth at bedtime.    Dispense:  90 tablet    Refill:  3  . atorvastatin (LIPITOR) 40 MG tablet    Sig: Take 1 tablet (40 mg total) by mouth daily.    Dispense:  90 tablet    Refill:  3     Marland Kitchen, DO PGY-3, [facility] 09/01/2020 8:54 PM

## 2020-09-01 NOTE — Assessment & Plan Note (Signed)
Chronic, uncontrolled. Multiple elevated BP's today in setting of not taking home BP meds. Would benefit from increase in BP medicine.  - increased Amlodipine to 10mg  QD - BP follow up in 2 weeks - consider at that time addition of low dose ACE/ARB if still elevated or if BP allows given microalbuminuria and T2DM - BMP today to monitor kidney function - follow up with PCP

## 2020-09-02 ENCOUNTER — Telehealth: Payer: Self-pay

## 2020-09-02 LAB — BASIC METABOLIC PANEL
BUN/Creatinine Ratio: 6 — ABNORMAL LOW (ref 9–20)
BUN: 5 mg/dL — ABNORMAL LOW (ref 6–24)
CO2: 21 mmol/L (ref 20–29)
Calcium: 9.4 mg/dL (ref 8.7–10.2)
Chloride: 100 mmol/L (ref 96–106)
Creatinine, Ser: 0.87 mg/dL (ref 0.76–1.27)
Glucose: 149 mg/dL — ABNORMAL HIGH (ref 65–99)
Potassium: 3.9 mmol/L (ref 3.5–5.2)
Sodium: 139 mmol/L (ref 134–144)
eGFR: 111 mL/min/{1.73_m2} (ref >59)

## 2020-09-02 LAB — LIPID PANEL
Chol/HDL Ratio: 2.7 ratio (ref 0.0–5.0)
Cholesterol, Total: 121 mg/dL (ref 100–199)
HDL: 45 mg/dL (ref >39)
LDL Chol Calc (NIH): 56 mg/dL (ref 0–99)
Triglycerides: 112 mg/dL (ref 0–149)
VLDL Cholesterol Cal: 20 mg/dL (ref 5–40)

## 2020-09-02 LAB — HEPATITIS C ANTIBODY: Hep C Virus Ab: <0.1 s/co ratio (ref 0.0–0.9)

## 2020-09-02 MED ORDER — ACCU-CHEK SOFTCLIX LANCETS MISC: 12 refills | Status: AC

## 2020-09-02 NOTE — Telephone Encounter (Signed)
Patients wife calls stating he needs accu chek lancets call in. Patients insurance company will not pay for the freesyle lancets. Will send in accu chek.

## 2020-09-07 ENCOUNTER — Encounter: Payer: Self-pay | Admitting: Family Medicine

## 2020-09-15 ENCOUNTER — Ambulatory Visit: Payer: Medicaid Other

## 2020-09-23 ENCOUNTER — Other Ambulatory Visit: Payer: Self-pay

## 2020-09-23 MED ORDER — ACCU-CHEK AVIVA PLUS VI STRP: ORAL_STRIP | 12 refills | Status: AC

## 2020-09-23 NOTE — Telephone Encounter (Signed)
Patient's wife calls nurse line requesting test strips for glucometer. Previous test strips did not fit meter. Sent in test strips per protocol.   Veronda Prude, RN

## 2020-10-08 ENCOUNTER — Ambulatory Visit: Payer: Medicaid Other | Admitting: Orthopaedic Surgery

## 2020-10-20 ENCOUNTER — Ambulatory Visit: Payer: Medicaid Other | Admitting: Orthopaedic Surgery

## 2020-11-01 ENCOUNTER — Other Ambulatory Visit: Payer: Self-pay | Admitting: Family Medicine

## 2020-11-01 DIAGNOSIS — E119 Type 2 diabetes mellitus without complications: Secondary | ICD-10-CM

## 2020-11-03 ENCOUNTER — Ambulatory Visit: Payer: Medicaid Other | Admitting: Orthopaedic Surgery

## 2020-11-16 ENCOUNTER — Other Ambulatory Visit: Payer: Self-pay | Admitting: Family Medicine

## 2020-11-16 DIAGNOSIS — E119 Type 2 diabetes mellitus without complications: Secondary | ICD-10-CM

## 2020-12-01 NOTE — Progress Notes (Deleted)
    SUBJECTIVE:   CHIEF COMPLAINT / HPI: f/u DM2, HTN, HLD  DM2: medications include metformin 1000 mg bid. Last A1c in March 2022 8.1%. Patient does not check blood sugars***. He has been compliant with metformin***.  HTN: patient on amlodipine 10 mg qd. BP ***at goal today.   HLD: atorvastatin 40 mg qd. He reports compliance.   Morbid Obesity:  Sleep problems:    PERTINENT  PMH / PSH: DM2, HTN, HLD, morbid obesity  OBJECTIVE:   There were no vitals taken for this visit.  ***  ASSESSMENT/PLAN:   No problem-specific Assessment & Plan notes found for this encounter.     Shirlean Mylar, MD [facility]   {    This will disappear when note is signed, click to select method of visit    :1}

## 2020-12-02 ENCOUNTER — Encounter: Payer: Self-pay | Admitting: Family Medicine

## 2020-12-02 ENCOUNTER — Ambulatory Visit: Payer: Medicaid Other | Admitting: Family Medicine

## 2020-12-28 ENCOUNTER — Encounter: Payer: Self-pay | Admitting: *Deleted

## 2021-04-13 ENCOUNTER — Ambulatory Visit: Payer: Medicaid Other | Admitting: Family Medicine

## 2021-07-14 NOTE — Progress Notes (Deleted)
° ° °  SUBJECTIVE:   CHIEF COMPLAINT / HPI:   Bloody stool:   PERTINENT  PMH / PSH: ***  OBJECTIVE:   There were no vitals taken for this visit.  ***  ASSESSMENT/PLAN:   No problem-specific Assessment & Plan notes found for this encounter.     Gladys Damme, MD [place]

## 2021-07-15 ENCOUNTER — Ambulatory Visit: Payer: Medicaid Other | Admitting: Family Medicine

## 2021-10-25 ENCOUNTER — Other Ambulatory Visit: Payer: Self-pay

## 2021-10-25 DIAGNOSIS — I10 Essential (primary) hypertension: Secondary | ICD-10-CM

## 2021-10-26 MED ORDER — AMLODIPINE BESYLATE 10 MG PO TABS: 10.0000 mg | ORAL_TABLET | Freq: Every day | ORAL | 0 refills | Status: DC

## 2021-11-09 ENCOUNTER — Other Ambulatory Visit: Payer: Self-pay | Admitting: Family Medicine

## 2021-11-09 DIAGNOSIS — I10 Essential (primary) hypertension: Secondary | ICD-10-CM

## 2021-11-15 ENCOUNTER — Encounter: Payer: Self-pay | Admitting: *Deleted

## 2022-02-09 ENCOUNTER — Encounter: Payer: Medicaid Other | Admitting: Family Medicine

## 2022-02-16 ENCOUNTER — Encounter: Payer: Medicaid Other | Admitting: Family Medicine

## 2022-02-23 ENCOUNTER — Encounter: Payer: Medicaid Other | Admitting: Student

## 2022-02-23 NOTE — Progress Notes (Deleted)
    SUBJECTIVE:   Chief compliant/HPI: annual examination  Andre Reynolds is a 41 y.o. who presents today for an annual exam.   History tabs reviewed and updated ***.   Review of systems form reviewed and notable for ***.   OBJECTIVE:   There were no vitals taken for this visit.  ***  ASSESSMENT/PLAN:   No problem-specific Assessment & Plan notes found for this encounter.    Annual Examination  See AVS for recommendations.  PHQ score ***, reviewed.  Blood pressure value is *** goal.  Advanced directives ***   Considered the following screening exams based upon USPSTF recommendations: Diabetes screening: {discussed/ordered:14545} Screening for elevated cholesterol: {discussed/ordered:14545} HIV testing: {discussed/ordered:14545} Hepatitis C: complete  Reviewed risk factors for latent tuberculosis and {not indicated/requested/declined:14582} Immunizations ***.   Follow up in 1 *** year or sooner if indicated.    Tiffany Kocher, MD [facility]

## 2022-03-05 NOTE — Progress Notes (Signed)
canceled

## 2022-03-05 NOTE — Progress Notes (Deleted)
   Office Visit Note   Patient: Andre Reynolds           Date of Birth: [birth year]           MRN: [id number] Visit Date: 04/29/2020              Requested by: Gladys Damme, MD No address on file PCP: Donney Dice, DO   Assessment & Plan: Visit Diagnoses: No diagnosis found.  Plan: ***  Follow-Up Instructions: No follow-ups on file.   Orders:  No orders of the defined types were placed in this encounter.  No orders of the defined types were placed in this encounter.     Procedures: No procedures performed   Clinical Data: No additional findings.   Subjective: Chief Complaint  Patient presents with   Right Wrist - Follow-up    HPI  Review of Systems   Objective: Vital Signs: Ht 5\' 8"  (1.727 m)   Wt (!) 305 lb (138.3 kg)   BMI 46.38 kg/m   Physical Exam  Ortho Exam  Specialty Comments:  No specialty comments available.  Imaging: No results found.   PMFS History: Patient Active Problem List   Diagnosis Date Noted   Hyperlipidemia associated with type 2 diabetes mellitus ([place]) 09/01/2020   Spondylolisthesis, lumbar region 05/12/2020   Close exposure to COVID-19 virus 05/15/2019   Need for immunization against influenza 02/12/2018   Straining with stools 02/12/2018   Diverticulitis    Sepsis ([place])    Microscopic hematuria 07/26/2017   Type 2 diabetes mellitus ([place]) 07/13/2017   Essential hypertension 07/04/2017   Severe obesity (BMI >= 40) ([place]) 02/19/2017   Diverticulosis of colon 02/19/2017   Tobacco use disorder 02/19/2017   Meralgia paresthetica, left 06/07/2016   Past Medical History:  Diagnosis Date   Asthma    Diabetes mellitus ([place])    DIVERTICULITIS, HX OF 08/03/2009   Qualifier: Diagnosis of  By: Vanessa Kick MD, Saralyn Pilar     Hypertension    Pneumonia     Family History  Problem Relation Age of Onset   Diabetes Mother    Alcohol abuse Mother    Asthma Mother    Hypertension Mother    Breast cancer Mother    Diabetes  Father    Drug abuse Sister    Diabetes Maternal Grandmother    Diabetes Maternal Aunt    Diabetes Cousin        maternal cousin    Past Surgical History:  Procedure Laterality Date   WISDOM TOOTH EXTRACTION     WRIST SURGERY     Social History   Occupational History   Occupation: not working  Tobacco Use   Smoking status: Every Day    Packs/day: 0.50    Years: 20.00    Total pack years: 10.00    Types: Cigarettes   Smokeless tobacco: Never  Vaping Use   Vaping Use: Never used  Substance and Sexual Activity   Alcohol use: Yes    Comment: 2-3 beer socially   Drug use: Yes    Types: Marijuana   Sexual activity: Yes    Partners: Female

## 2022-06-23 ENCOUNTER — Emergency Department (HOSPITAL_COMMUNITY): Payer: Medicaid Other

## 2022-06-23 ENCOUNTER — Emergency Department (HOSPITAL_COMMUNITY)
Admission: EM | Admit: 2022-06-23 | Discharge: 2022-06-23 | Disposition: A | Discharge status: Home / Self Care | Payer: Medicaid Other | Attending: Emergency Medicine | Admitting: Emergency Medicine

## 2022-06-23 ENCOUNTER — Other Ambulatory Visit: Payer: Self-pay

## 2022-06-23 ENCOUNTER — Encounter (HOSPITAL_COMMUNITY): Payer: Self-pay

## 2022-06-23 DIAGNOSIS — R000 Tachycardia, unspecified: Secondary | ICD-10-CM | POA: Diagnosis not present

## 2022-06-23 DIAGNOSIS — R112 Nausea with vomiting, unspecified: Secondary | ICD-10-CM | POA: Diagnosis not present

## 2022-06-23 DIAGNOSIS — E1165 Type 2 diabetes mellitus with hyperglycemia: Secondary | ICD-10-CM | POA: Insufficient documentation

## 2022-06-23 DIAGNOSIS — I10 Essential (primary) hypertension: Secondary | ICD-10-CM | POA: Insufficient documentation

## 2022-06-23 DIAGNOSIS — Z7984 Long term (current) use of oral hypoglycemic drugs: Secondary | ICD-10-CM | POA: Insufficient documentation

## 2022-06-23 DIAGNOSIS — Z1152 Encounter for screening for COVID-19: Secondary | ICD-10-CM | POA: Diagnosis not present

## 2022-06-23 DIAGNOSIS — R531 Weakness: Secondary | ICD-10-CM | POA: Diagnosis not present

## 2022-06-23 DIAGNOSIS — Z79899 Other long term (current) drug therapy: Secondary | ICD-10-CM | POA: Diagnosis not present

## 2022-06-23 DIAGNOSIS — R079 Chest pain, unspecified: Secondary | ICD-10-CM | POA: Diagnosis not present

## 2022-06-23 DIAGNOSIS — R7401 Elevation of levels of liver transaminase levels: Secondary | ICD-10-CM | POA: Diagnosis not present

## 2022-06-23 DIAGNOSIS — D72829 Elevated white blood cell count, unspecified: Secondary | ICD-10-CM | POA: Diagnosis not present

## 2022-06-23 DIAGNOSIS — R0789 Other chest pain: Secondary | ICD-10-CM | POA: Diagnosis not present

## 2022-06-23 DIAGNOSIS — N179 Acute kidney failure, unspecified: Secondary | ICD-10-CM | POA: Diagnosis not present

## 2022-06-23 DIAGNOSIS — R509 Fever, unspecified: Secondary | ICD-10-CM | POA: Diagnosis not present

## 2022-06-23 LAB — BASIC METABOLIC PANEL
Anion gap: 10 (ref 5–15)
BUN: 24 mg/dL — ABNORMAL HIGH (ref 6–20)
CO2: 21 mmol/L — ABNORMAL LOW (ref 22–32)
Calcium: 8.7 mg/dL — ABNORMAL LOW (ref 8.9–10.3)
Chloride: 102 mmol/L (ref 98–111)
Creatinine, Ser: 1.39 mg/dL — ABNORMAL HIGH (ref 0.61–1.24)
GFR, Estimated: >60 mL/min (ref >60)
Glucose, Bld: 215 mg/dL — ABNORMAL HIGH (ref 70–99)
Potassium: 4 mmol/L (ref 3.5–5.1)
Sodium: 133 mmol/L — ABNORMAL LOW (ref 135–145)

## 2022-06-23 LAB — HEPATIC FUNCTION PANEL
ALT: 85 U/L — ABNORMAL HIGH (ref 0–44)
AST: 66 U/L — ABNORMAL HIGH (ref 15–41)
Albumin: 4.1 g/dL (ref 3.5–5.0)
Alkaline Phosphatase: 90 U/L (ref 38–126)
Bilirubin, Direct: 0.1 mg/dL (ref 0.0–0.2)
Indirect Bilirubin: 0.6 mg/dL (ref 0.3–0.9)
Total Bilirubin: 0.7 mg/dL (ref 0.3–1.2)
Total Protein: 7.3 g/dL (ref 6.5–8.1)

## 2022-06-23 LAB — LIPASE, BLOOD: Lipase: 29 U/L (ref 11–51)

## 2022-06-23 LAB — URINALYSIS, ROUTINE W REFLEX MICROSCOPIC
Bilirubin Urine: NEGATIVE
Glucose, UA: NEGATIVE mg/dL
Ketones, ur: 5 mg/dL — AB
Nitrite: NEGATIVE
Protein, ur: NEGATIVE mg/dL
Specific Gravity, Urine: 1.019 (ref 1.005–1.030)
pH: 5 (ref 5.0–8.0)

## 2022-06-23 LAB — CBC
HCT: 43.1 % (ref 39.0–52.0)
Hemoglobin: 14 g/dL (ref 13.0–17.0)
MCH: 26.9 pg (ref 26.0–34.0)
MCHC: 32.5 g/dL (ref 30.0–36.0)
MCV: 82.9 fL (ref 80.0–100.0)
Platelets: 269 10*3/uL (ref 150–400)
RBC: 5.2 MIL/uL (ref 4.22–5.81)
RDW: 15 % (ref 11.5–15.5)
WBC: 16.7 10*3/uL — ABNORMAL HIGH (ref 4.0–10.5)
nRBC: 0 % (ref 0.0–0.2)

## 2022-06-23 LAB — TROPONIN I (HIGH SENSITIVITY)
Troponin I (High Sensitivity): 25 ng/L — ABNORMAL HIGH (ref <18)
Troponin I (High Sensitivity): 32 ng/L — ABNORMAL HIGH (ref <18)

## 2022-06-23 LAB — RESP PANEL BY RT-PCR (RSV, FLU A&B, COVID)  RVPGX2
Influenza A by PCR: NEGATIVE
Influenza B by PCR: NEGATIVE
Resp Syncytial Virus by PCR: NEGATIVE
SARS Coronavirus 2 by RT PCR: NEGATIVE

## 2022-06-23 MED ORDER — ONDANSETRON HCL 4 MG PO TABS: 4.0000 mg | ORAL_TABLET | Freq: Four times a day (QID) | ORAL | 0 refills | Status: DC

## 2022-06-23 MED ORDER — LACTATED RINGERS IV BOLUS
1000.0000 mL | Freq: Once | INTRAVENOUS | Status: AC
  Administered 2022-06-23: 1000 mL via INTRAVENOUS

## 2022-06-23 MED ORDER — ONDANSETRON 4 MG PO TBDP
4.0000 mg | ORAL_TABLET | Freq: Once | ORAL | Status: AC
  Administered 2022-06-23: 4 mg via ORAL
  Filled 2022-06-23: qty 1

## 2022-06-23 NOTE — ED Provider Notes (Signed)
[facility] EMERGENCY DEPARTMENT Provider Note   CSN: [id number] Arrival date & time: 06/23/22  1606     History  No chief complaint on file.   Andre Reynolds is a 42 y.o. male.  Patient is a 42 year old male with a past medical history of hypertension and diabetes presenting to the emergency department with nausea and vomiting.  The patient states that he woke up this morning feeling some body aches and fatigue.  He states that he tried to eat breakfast this morning but felt nauseous and vomited x 2.  He states that he started to have intermittent sharp stabbing chest pains.  He states that this afternoon he started to feel like his heart was racing so he called 911.  He states that when medics arrived they informed him that he had a fever and was tachycardic and brought him to the emergency department.  Patient denies any associated abdominal pain, diarrhea or constipation, dysuria or hematuria.  He denies any cough or congestion or known sick contacts.  The history is provided by the patient.       Home Medications Prior to Admission medications   Medication Sig Start Date End Date Taking? Authorizing Provider  ondansetron (ZOFRAN) 4 MG tablet Take 1 tablet (4 mg total) by mouth every 6 (six) hours. 06/23/22  Yes Leanord Asal K, DO  Accu-Chek Softclix Lancets lancets Use to check blood sugar 3 times per day. 09/02/20   Mullis, Kiersten P, DO  amLODipine (NORVASC) 10 MG tablet Take 1 tablet (10 mg total) by mouth at bedtime. NEEDS APPT FOR FUTURE REFILLS. 10/26/21   Gladys Damme, MD  atorvastatin (LIPITOR) 40 MG tablet Take 1 tablet (40 mg total) by mouth daily. 09/01/20   Mullis, Kiersten P, DO  Blood Glucose Monitoring Suppl (ACCU-CHEK COMPACT CARE KIT) KIT Use to check sugar three times a day 07/19/17   Tonette Bihari, MD  diclofenac Sodium (VOLTAREN) 1 % GEL Apply 2 g topically 4 (four) times daily. 04/18/20   Blanchie Dessert, MD  glucose blood  (ACCU-CHEK AVIVA PLUS) test strip Please use to check blood glucose up to three times per day. E11.9 09/23/20   Gladys Damme, MD  Lancet Devices (LANCING DEVICE) MISC Use to check sugar three times a day 09/01/20   Mullis, Kiersten P, DO  lidocaine (LIDODERM) 5 % Place 1 patch onto the skin daily. Remove & Discard patch within 12 hours or as directed by MD 04/18/20   Blanchie Dessert, MD  meloxicam (MOBIC) 7.5 MG tablet Take 7.5 mg by mouth 2 (two) times daily. 05/13/20   [provider]  metFORMIN (GLUCOPHAGE-XR) 500 MG 24 hr tablet TAKE 2 TABLETS BY MOUTH TWICE A DAY 11/17/20   Gladys Damme, MD  polyethylene glycol powder (GLYCOLAX/MIRALAX) 17 GM/SCOOP powder Take 17 g by mouth 2 (two) times daily as needed. 05/27/19   Bonnita Hollow, MD      Allergies    Patient has no known allergies.    Review of Systems   Review of Systems  Physical Exam Updated Vital Signs BP (!) 141/66 (BP Location: Right Arm)   Pulse (!) 104   Temp 99 F (37.2 C) (Oral)   Resp 20   Ht 5\' 8"  (1.727 m)   Wt 131.5 kg   SpO2 96%   BMI 44.09 kg/m  Physical Exam Vitals and nursing note reviewed.  Constitutional:      General: He is not in acute distress.  Appearance: Normal appearance. He is obese.  HENT:     Head: Normocephalic and atraumatic.     Nose: Nose normal.     Mouth/Throat:     Mouth: Mucous membranes are moist.     Pharynx: Oropharynx is clear.  Eyes:     Extraocular Movements: Extraocular movements intact.     Conjunctiva/sclera: Conjunctivae normal.  Cardiovascular:     Rate and Rhythm: Regular rhythm. Tachycardia present.     Pulses: Normal pulses.     Heart sounds: Normal heart sounds.  Pulmonary:     Effort: Pulmonary effort is normal.     Breath sounds: Normal breath sounds.  Abdominal:     General: Abdomen is flat.     Palpations: Abdomen is soft.     Tenderness: There is no abdominal tenderness.  Musculoskeletal:        General: Normal range of motion.      Cervical back: Normal range of motion and neck supple.  Skin:    General: Skin is warm and dry.  Neurological:     General: No focal deficit present.     Mental Status: He is alert and oriented to person, place, and time.  Psychiatric:        Mood and Affect: Mood normal.        Behavior: Behavior normal.     ED Results / Procedures / Treatments   Labs (all labs ordered are listed, but only abnormal results are displayed) Labs Reviewed  BASIC METABOLIC PANEL - Abnormal; Notable for the following components:      Result Value   Sodium 133 (*)    CO2 21 (*)    Glucose, Bld 215 (*)    BUN 24 (*)    Creatinine, Ser 1.39 (*)    Calcium 8.7 (*)    All other components within normal limits  CBC - Abnormal; Notable for the following components:   WBC 16.7 (*)    All other components within normal limits  HEPATIC FUNCTION PANEL - Abnormal; Notable for the following components:   AST 66 (*)    ALT 85 (*)    All other components within normal limits  URINALYSIS, ROUTINE W REFLEX MICROSCOPIC - Abnormal; Notable for the following components:   Hgb urine dipstick MODERATE (*)    Ketones, ur 5 (*)    Leukocytes,Ua SMALL (*)    Bacteria, UA RARE (*)    All other components within normal limits  TROPONIN I (HIGH SENSITIVITY) - Abnormal; Notable for the following components:   Troponin I (High Sensitivity) 25 (*)    All other components within normal limits  TROPONIN I (HIGH SENSITIVITY) - Abnormal; Notable for the following components:   Troponin I (High Sensitivity) 32 (*)    All other components within normal limits  RESP PANEL BY RT-PCR (RSV, FLU A&B, COVID)  RVPGX2  LIPASE, BLOOD    EKG EKG Interpretation  Date/Time:  Friday June 23 2022 16:13:03 EST Ventricular Rate:  122 PR Interval:  156 QRS Duration: 72 QT Interval:  322 QTC Calculation: 458 R Axis:   10 Text Interpretation: Sinus tachycardia Otherwise normal ECG  Rate incraesed, otherwise no significant change  from prior EKG Confirmed by Elayne Snare ([id number]) on 06/23/2022 6:17:49 PM  Radiology DG Chest 2 View  Result Date: 06/23/2022 CLINICAL DATA:  Chest pain. EXAM: CHEST - 2 VIEW COMPARISON:  Chest x-ray 12/26/2015. FINDINGS: The heart size and mediastinal contours are within normal limits. Both lungs are clear.  No visible pleural effusions or pneumothorax. No acute osseous abnormality. IMPRESSION: No active cardiopulmonary disease. Electronically Signed   By: Feliberto Harts M.D.   On: 06/23/2022 16:54    Procedures Procedures    Medications Ordered in ED Medications  ondansetron (ZOFRAN-ODT) disintegrating tablet 4 mg (4 mg Oral Given 06/23/22 1846)  lactated ringers bolus 1,000 mL (0 mLs Intravenous Stopped 06/23/22 2003)    ED Course/ Medical Decision Making/ A&P Clinical Course as of 06/23/22 2049  Fri Jun 23, 2022  2020 Troponin is flat, mild transaminitis compared to prior but no abdominal pain making cholelithiasis or cholecystitis unlikely. UA negative for UTI. [VK]  2049 Upon reassessment, the patient states that his symptoms have resolved and he improved.  The patient is stable for discharge home with close primary care follow-up for repeat creatinine and LFTs.  He was given strict return precautions. [VK]    Clinical Course User Index [VK] Rexford Maus, DO                             Medical Decision Making This patient presents to the ED with chief complaint(s) of nausea vomiting and chest pain with pertinent past medical history of hypertension, diabetes which further complicates the presenting complaint. The complaint involves an extensive differential diagnosis and also carries with it a high risk of complications and morbidity.    The differential diagnosis includes ACS, arrhythmia, pneumonia, viral syndrome, gastritis, gastroenteritis, pancreatitis, hepatitis, cholelithiasis or cholecystitis or other intra-abdominal infection less likely as he has no point  abdominal tenderness, pulmonary edema, pleural effusion, pneumothorax dehydration, electrolyte abnormality  Additional history obtained: Additional history obtained from N/A Records reviewed Primary Care Documents  ED Course and Reassessment: Patient was initially evaluated by provider in triage and had EKG, labs including troponin and chest x-ray performed.  EKG had no acute ischemic changes and initial troponin was mildly elevated at 25.  He does have a mild AKI with a creatinine of 1.4 from baseline of 1 and he is mildly hyperglycemic without signs of DKA.  He does have a leukocytosis of 16.  Chest x-ray was negative and viral swab was negative.  He will additionally have LFTs and lipase, he will be given fluids and Zofran and urine evaluated.  He will have a repeat troponin and will be closely reassessed.  Independent labs interpretation:  The following labs were independently interpreted: mild trop elevation -> flat on repeat, mild AKI, mild transaminitis, leukocytosis, negative UA and viral swab  Independent visualization of imaging: - I independently visualized the following imaging with scope of interpretation limited to determining acute life threatening conditions related to emergency care: CXR, which revealed no acute disease  Consultation: - Consulted or discussed management/test interpretation w/ external professional: N/A  Consideration for admission or further workup: Patient has no emergent conditions requiring admission or further work-up at this time and is stable for discharge home with primary care follow-up  Social Determinants of health: n/A    Amount and/or Complexity of Data Reviewed Labs: ordered.          Final Clinical Impression(s) / ED Diagnoses Final diagnoses:  Nausea and vomiting, unspecified vomiting type  AKI (acute kidney injury) (HCC)  Transaminitis    Rx / DC Orders ED Discharge Orders          Ordered    ondansetron (ZOFRAN) 4 MG  tablet  Every 6 hours  06/23/22 2047              Kemper Durie, DO 06/23/22 2049

## 2022-06-23 NOTE — Discharge Instructions (Addendum)
You were seen in the emergency department for your nausea and vomiting.  You did appear slightly dehydrated and had a slight increase in your kidney function.  Your liver numbers were also mildly elevated, however the rest of your workup showed no obvious infection.  You likely have a viral syndrome causing your symptoms and I have given you a nausea medication that you can continue to take as needed.  You should make sure that you are drinking plenty of fluids and you should follow-up with your primary doctor within the next week to have your kidney function and liver numbers rechecked.  You should return to the emergency department if you are having repetitive vomiting despite the nausea medication, severe abdominal pain or any other new or concerning symptoms.

## 2022-06-23 NOTE — ED Triage Notes (Signed)
Pt BIB [facility] from home with c/o tachycardia in the 130s, feeling CP plus bil side pain, & nausea that began today. Fever of 100.7, weakness, chills, has been given Tylenol & ASA before arriving to ED. A/Ox4.

## 2022-06-23 NOTE — ED Provider Triage Note (Signed)
Emergency Medicine Provider Triage Evaluation Note  Andre Reynolds , a 42 y.o. male  was evaluated in triage.  Pt complains of chest pain, nausea, vomiting, feeling like heart racing. Began having cough, sore throat this morning. Hx htn, dm, tobacco use. No previous ACS.  Review of Systems  Positive: Chest pain, shob, nausea, vomiting, weakness, fever Negative: Loss of consciousness leg swelling  Physical Exam  There were no vitals taken for this visit. Gen:   Awake, no distress   Resp:  Normal effort  MSK:   Moves extremities without difficulty  Other:  Tachycardic with normal rhythm, warm to touch  Medical Decision Making  Medically screening exam initiated at 4:13 PM.  Appropriate orders placed.  Andre Reynolds was informed that the remainder of the evaluation will be completed by another provider, this initial triage assessment does not replace that evaluation, and the importance of remaining in the ED until their evaluation is complete.  Workup initiated   Anselmo Pickler, [place] 06/23/22 1625

## 2022-06-29 ENCOUNTER — Encounter: Payer: Self-pay | Admitting: Family Medicine

## 2022-06-29 ENCOUNTER — Ambulatory Visit: Payer: Medicaid Other | Admitting: Family Medicine

## 2022-06-29 VITALS — BP 110/64 | HR 74 | Ht 68.0 in | Wt 291.6 lb

## 2022-06-29 DIAGNOSIS — R7989 Other specified abnormal findings of blood chemistry: Secondary | ICD-10-CM | POA: Diagnosis not present

## 2022-06-29 DIAGNOSIS — E785 Hyperlipidemia, unspecified: Secondary | ICD-10-CM | POA: Diagnosis not present

## 2022-06-29 DIAGNOSIS — R748 Abnormal levels of other serum enzymes: Secondary | ICD-10-CM | POA: Diagnosis not present

## 2022-06-29 DIAGNOSIS — E119 Type 2 diabetes mellitus without complications: Secondary | ICD-10-CM

## 2022-06-29 DIAGNOSIS — E1169 Type 2 diabetes mellitus with other specified complication: Secondary | ICD-10-CM | POA: Diagnosis not present

## 2022-06-29 DIAGNOSIS — I10 Essential (primary) hypertension: Secondary | ICD-10-CM

## 2022-06-29 DIAGNOSIS — R112 Nausea with vomiting, unspecified: Secondary | ICD-10-CM | POA: Insufficient documentation

## 2022-06-29 LAB — POCT GLYCOSYLATED HEMOGLOBIN (HGB A1C): HbA1c, POC (controlled diabetic range): 7.3 % — AB (ref 0.0–7.0)

## 2022-06-29 MED ORDER — ATORVASTATIN CALCIUM 40 MG PO TABS: 40.0000 mg | ORAL_TABLET | Freq: Every day | ORAL | 3 refills | Status: DC

## 2022-06-29 MED ORDER — METFORMIN HCL ER 500 MG PO TB24: 1000.0000 mg | ORAL_TABLET | Freq: Two times a day (BID) | ORAL | 1 refills | Status: DC

## 2022-06-29 NOTE — Patient Instructions (Signed)
It was wonderful to see you today.  Please bring ALL of your medications with you to every visit.   Today we talked about:  We are doing lab work today to check your liver function and kidney function. I will send you a MyChart message if you have MyChart. Otherwise, I will give you a call for abnormal results or send a letter if everything returned back normal. If you don't hear from me in 2 weeks, please call the office.    Thank you for coming to your visit as scheduled. We have had a large "no-show" problem lately, and this significantly limits our ability to see and care for patients. As a friendly reminder- if you cannot make your appointment please call to cancel. We do have a no show policy for those who do not cancel within 24 hours. Our policy is that if you miss or fail to cancel an appointment within 24 hours, 3 times in a 64-month period, you may be dismissed from our clinic.   Thank you for choosing [facility].   Please call [phone] with any questions about today's appointment.  Please be sure to schedule follow up at the front  desk before you leave today.   Sharion Settler, DO PGY-3 Family Medicine

## 2022-06-29 NOTE — Assessment & Plan Note (Signed)
Refilled atorvastatin today. -Lipid panel at f/u visit 2/8

## 2022-06-29 NOTE — Assessment & Plan Note (Signed)
Has been out of his amlodipine for over 6 months.  Blood pressure is normotensive today without any antihypertensives on  board.  Will hold off on refilling amlodipine for now.  Will follow-up on his blood pressure at our next appointment in February.

## 2022-06-29 NOTE — Progress Notes (Signed)
    SUBJECTIVE:   CHIEF COMPLAINT / HPI:   Andre Reynolds is a 42 y.o. male who presents to the [facility] clinic today to discuss the following concerns:   ED F/U: N/V Patient was seen in the ED on 1/12 for nausea and vomiting. He arrived via EMS as he states he had a fever and his heart was "racing" which made him worried. His symptoms had started that day. Tested negative for flu and COVID, lab work revealed slightly elevated creatinine to 1.39 (baseline ~1), and elevated LFTs (AST 66, ALT 85). CXR was negative. Patient was given a dose of Zofran and a liter bolus of LR.  He returns today for follow-up lab work.  He reports still feeling nauseous but has not vomited in the last 2 days. He is eating soft foods and hydrating. No diarrhea. Has not had any recurrent fevers. Has some epigastric abdominal pain when he eats. He states it feels like when he had diverticulitis. He is having normal bowel movements.   PERTINENT  PMH / PSH: HTN, tobacco use disorder   OBJECTIVE:   BP 110/64   Pulse 74   Ht 5\' 8"  (1.727 m)   Wt 291 lb 9.6 oz (132.3 kg)   SpO2 96%   BMI 44.34 kg/m    General: NAD, pleasant, able to participate in exam Cardiac: RRR, no murmurs. Respiratory: normal effort Abdomen: Obese abdomen, bowel sounds present, mild ttp LLQ without R/G, no ttp to epigastric region, nondistended, no hepatosplenomegaly. Psych: Normal affect and mood  ASSESSMENT/PLAN:   Essential hypertension Has been out of his amlodipine for over 6 months.  Blood pressure is normotensive today without any antihypertensives on  board.  Will hold off on refilling amlodipine for now.  Will follow-up on his blood pressure at our next appointment in February.   Type 2 diabetes mellitus ([place]) Has not had an A1c checked since 2019. Hgb A1c today was 7.3. He has been out of his Metformin for over 6 months. -Refilled Metformin -next A1c in 3 months  -Urine follow-up -Referral to ophthalmology follow-up -Foot exam  at follow-up  Hyperlipidemia associated with type 2 diabetes mellitus ([place]) Refilled atorvastatin today. -Lipid panel at f/u visit 2/8  Nausea and vomiting Overall his symptoms seem to have improved since onset.  He is keeping p.o. down, has not had recurrence of fever and his abdominal pain is overall improving per his report. We will check labs to ensure creatinine and LFT's are trending down. Encouraged continued hydration and supportive measures. Strict return precautions provided. May benefit from abx for diverticulitis vs abdominal imaging if pain intensifies, fevers recur, etc.  -CMP     Sharion Settler, [place]

## 2022-06-29 NOTE — Assessment & Plan Note (Signed)
Overall his symptoms seem to have improved since onset.  He is keeping p.o. down, has not had recurrence of fever and his abdominal pain is overall improving per his report. We will check labs to ensure creatinine and LFT's are trending down. Encouraged continued hydration and supportive measures. Strict return precautions provided. May benefit from abx for diverticulitis vs abdominal imaging if pain intensifies, fevers recur, etc.  -CMP

## 2022-06-29 NOTE — Assessment & Plan Note (Signed)
Has not had an A1c checked since 2019. Hgb A1c today was 7.3. He has been out of his Metformin for over 6 months. -Refilled Metformin -next A1c in 3 months  -Urine follow-up -Referral to ophthalmology follow-up -Foot exam at follow-up

## 2022-06-30 LAB — COMPREHENSIVE METABOLIC PANEL
ALT: 30 IU/L (ref 0–44)
AST: 14 IU/L (ref 0–40)
Albumin/Globulin Ratio: 1.5 (ref 1.2–2.2)
Albumin: 4 g/dL — ABNORMAL LOW (ref 4.1–5.1)
Alkaline Phosphatase: 111 IU/L (ref 44–121)
BUN/Creatinine Ratio: 13 (ref 9–20)
BUN: 13 mg/dL (ref 6–24)
Bilirubin Total: 0.3 mg/dL (ref 0.0–1.2)
CO2: 21 mmol/L (ref 20–29)
Calcium: 9.4 mg/dL (ref 8.7–10.2)
Chloride: 102 mmol/L (ref 96–106)
Creatinine, Ser: 1 mg/dL (ref 0.76–1.27)
Globulin, Total: 2.7 g/dL (ref 1.5–4.5)
Glucose: 129 mg/dL — ABNORMAL HIGH (ref 70–99)
Potassium: 4.1 mmol/L (ref 3.5–5.2)
Sodium: 140 mmol/L (ref 134–144)
Total Protein: 6.7 g/dL (ref 6.0–8.5)
eGFR: 96 mL/min/{1.73_m2} (ref >59)

## 2022-07-20 ENCOUNTER — Encounter: Payer: Self-pay | Admitting: Family Medicine

## 2022-10-19 ENCOUNTER — Ambulatory Visit: Payer: Medicaid Other | Admitting: Physician Assistant

## 2022-12-06 ENCOUNTER — Ambulatory Visit: Payer: Medicaid Other | Admitting: Orthopaedic Surgery

## 2022-12-06 ENCOUNTER — Other Ambulatory Visit (INDEPENDENT_AMBULATORY_CARE_PROVIDER_SITE_OTHER): Payer: Medicaid Other

## 2022-12-06 DIAGNOSIS — M25562 Pain in left knee: Secondary | ICD-10-CM | POA: Diagnosis not present

## 2022-12-06 MED ORDER — METHYLPREDNISOLONE ACETATE 40 MG/ML IJ SUSP
40.0000 mg | INTRAMUSCULAR | Status: AC | PRN
  Administered 2022-12-06: 40 mg via INTRA_ARTICULAR

## 2022-12-06 MED ORDER — BUPIVACAINE HCL 0.5 % IJ SOLN
2.0000 mL | INTRAMUSCULAR | Status: AC | PRN
  Administered 2022-12-06: 2 mL via INTRA_ARTICULAR

## 2022-12-06 MED ORDER — LIDOCAINE HCL 1 % IJ SOLN
2.0000 mL | INTRAMUSCULAR | Status: AC | PRN
  Administered 2022-12-06: 2 mL

## 2022-12-06 NOTE — Progress Notes (Signed)
Office Visit Note   Patient: Andre Reynolds           Date of Birth: 1981-02-11           MRN: [id number] Visit Date: 12/06/2022              Requested by: Reece Leader, DO [address] [place],  [place] [address] PCP: Reece Leader, DO   Assessment & Plan: Visit Diagnoses:  1. Acute pain of left knee     Plan: Andre Reynolds is a 42 year old gentleman with left knee pain and effusion concerning for medial meniscal tear.  We will start with left knee aspiration and cortisone injection followed by 6 weeks of rest and home exercises.  He will let me know if symptoms persist at which point we will need an MRI.  He tolerated the aspiration and injection well today.  25 cc aspirated and sent to the lab.  Follow-Up Instructions: No follow-ups on file.   Orders:  Orders Placed This Encounter  Procedures   XR KNEE 3 VIEW LEFT   No orders of the defined types were placed in this encounter.     Procedures: Large Joint Inj: L knee on 12/06/2022 8:25 AM Details: 22 G needle Medications: 2 mL bupivacaine 0.5 %; 2 mL lidocaine 1 %; 40 mg methylPREDNISolone acetate 40 MG/ML Outcome: tolerated well, no immediate complications Patient was prepped and draped in the usual sterile fashion.       Clinical Data: No additional findings.   Subjective: Chief Complaint  Patient presents with   Left Knee - Pain    HPI Andre Reynolds is a 42 year old gentleman here for evaluation of left knee pain that started a week ago.  Denies any injuries or changes in activity.  Does report swelling.  Tylenol does not help with the pain.  He reports clicking and popping inside the knee. Review of Systems  Constitutional: Negative.   HENT: Negative.    Eyes: Negative.   Respiratory: Negative.    Cardiovascular: Negative.   Gastrointestinal: Negative.   Endocrine: Negative.   Genitourinary: Negative.   Skin: Negative.   Allergic/Immunologic: Negative.   Neurological: Negative.   Hematological: Negative.    Psychiatric/Behavioral: Negative.    All other systems reviewed and are negative.    Objective: Vital Signs: There were no vitals taken for this visit.  Physical Exam Vitals and nursing note reviewed.  Constitutional:      Appearance: He is well-developed.  HENT:     Head: Normocephalic and atraumatic.  Eyes:     Pupils: Pupils are equal, round, and reactive to light.  Pulmonary:     Effort: Pulmonary effort is normal.  Abdominal:     Palpations: Abdomen is soft.  Musculoskeletal:        General: Normal range of motion.     Cervical back: Neck supple.  Skin:    General: Skin is warm.  Neurological:     Mental Status: He is alert and oriented to person, place, and time.  Psychiatric:        Behavior: Behavior normal.        Thought Content: Thought content normal.        Judgment: Judgment normal.     Ortho Exam Examination of the left knee shows moderate joint effusion.  There is tenderness to the medial joint line and the medial retinaculum.  Exam is otherwise unremarkable. Specialty Comments:  No specialty comments available.  Imaging: No results found.   PMFS  History: Patient Active Problem List   Diagnosis Date Noted   Nausea and vomiting 06/29/2022   Hyperlipidemia associated with type 2 diabetes mellitus (HCC) 09/01/2020   Spondylolisthesis, lumbar region 05/12/2020   Close exposure to COVID-19 virus 05/15/2019   Need for immunization against influenza 02/12/2018   Straining with stools 02/12/2018   Diverticulitis    Sepsis (HCC)    Microscopic hematuria 07/26/2017   Type 2 diabetes mellitus (HCC) 07/13/2017   Essential hypertension 07/04/2017   Severe obesity (BMI >= 40) (HCC) 02/19/2017   Diverticulosis of colon 02/19/2017   Tobacco use disorder 02/19/2017   Meralgia paresthetica, left 06/07/2016   Past Medical History:  Diagnosis Date   Asthma    Diabetes mellitus (HCC)    DIVERTICULITIS, HX OF 08/03/2009   Qualifier: Diagnosis of  By:  Gilford Rile MD, Luisa Hart     Hypertension    Pneumonia     Family History  Problem Relation Age of Onset   Diabetes Mother    Alcohol abuse Mother    Asthma Mother    Hypertension Mother    Breast cancer Mother    Diabetes Father    Drug abuse Sister    Diabetes Maternal Grandmother    Diabetes Maternal Aunt    Diabetes Cousin        maternal cousin    Past Surgical History:  Procedure Laterality Date   WISDOM TOOTH EXTRACTION     WRIST SURGERY     Social History   Occupational History   Occupation: not working  Tobacco Use   Smoking status: Every Day    Packs/day: 0.50    Years: 20.00    Additional pack years: 0.00    Total pack years: 10.00    Types: Cigarettes   Smokeless tobacco: Never  Vaping Use   Vaping Use: Never used  Substance and Sexual Activity   Alcohol use: Yes    Comment: 2-3 beer socially   Drug use: Yes    Types: Marijuana   Sexual activity: Yes    Partners: Female

## 2022-12-07 LAB — SYNOVIAL FLUID ANALYSIS, COMPLETE
Basophils, %: 0 %
Eosinophils-Synovial: 0 % (ref 0–2)
Lymphocytes-Synovial Fld: 12 % (ref 0–74)
Monocyte/Macrophage: 85 % — ABNORMAL HIGH (ref 0–69)
Neutrophil, Synovial: 3 % (ref 0–24)
Synoviocytes, %: 0 % (ref 0–15)
WBC, Synovial: 309 cells/uL — ABNORMAL HIGH (ref <150)

## 2022-12-28 ENCOUNTER — Telehealth: Payer: Self-pay | Admitting: Orthopaedic Surgery

## 2022-12-28 NOTE — Telephone Encounter (Signed)
Patient's wife called. Says he is still having a lot of pain in that knee. Her [phone]

## 2023-01-01 ENCOUNTER — Other Ambulatory Visit: Payer: Self-pay | Admitting: Physician Assistant

## 2023-01-01 ENCOUNTER — Other Ambulatory Visit: Payer: Self-pay

## 2023-01-01 DIAGNOSIS — M25562 Pain in left knee: Secondary | ICD-10-CM

## 2023-01-01 MED ORDER — TRAMADOL HCL 50 MG PO TABS: 50.0000 mg | ORAL_TABLET | Freq: Two times a day (BID) | ORAL | 0 refills | Status: DC | PRN

## 2023-01-01 NOTE — Telephone Encounter (Signed)
Spoke to wife.  Sent in tramadol.  Lauren, can we order an MRI of the left knee?

## 2023-01-01 NOTE — Telephone Encounter (Signed)
Mardella Layman do you mind checking in with him.  Thanks.

## 2023-01-29 ENCOUNTER — Encounter: Payer: Medicaid Other | Admitting: Family Medicine

## 2023-04-27 ENCOUNTER — Telehealth: Payer: Self-pay | Admitting: Orthopaedic Surgery

## 2023-04-27 NOTE — Telephone Encounter (Signed)
Can you please advise on message below since Mardella Layman is off this PM. Thank you.

## 2023-04-27 NOTE — Telephone Encounter (Signed)
Pt's wife called requesting pain meds for pt. She states pt knee has gotten worse and MRI is not until 12/5 MRI. Please send to pharmacy [facility]. Pt phone number is [phone].

## 2023-05-07 ENCOUNTER — Encounter: Payer: Self-pay | Admitting: Physician Assistant

## 2023-05-17 ENCOUNTER — Ambulatory Visit
Admission: RE | Admit: 2023-05-17 | Discharge: 2023-05-17 | Disposition: A | Discharge status: Home / Self Care | Payer: Medicaid Other | Source: Ambulatory Visit | Attending: Physician Assistant | Admitting: Physician Assistant

## 2023-05-17 DIAGNOSIS — M1712 Unilateral primary osteoarthritis, left knee: Secondary | ICD-10-CM | POA: Diagnosis not present

## 2023-05-17 DIAGNOSIS — M25562 Pain in left knee: Secondary | ICD-10-CM

## 2023-05-17 DIAGNOSIS — M25462 Effusion, left knee: Secondary | ICD-10-CM | POA: Diagnosis not present

## 2023-05-28 NOTE — Progress Notes (Signed)
F/u with xu to discuss

## 2023-05-30 NOTE — Progress Notes (Unsigned)
Office Visit Note   Patient: Andre Reynolds           Date of Birth: [birth year]           MRN: [id number] Visit Date: 05/31/2023              Requested by: Gerrit Heck, DO [address] [place],  [place] [address] PCP: Gerrit Heck, DO   Assessment & Plan: Visit Diagnoses:  1. Acute pain of left knee   2. 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  Degenerative signals in the menisci without any overt tears.  Based on these findings I have recommended cortisone injection which he tolerated well.  Follow-up as needed.  Follow-Up Instructions: No follow-ups on file.   Orders:  Orders Placed This Encounter  Procedures   Large Joint Inj   No orders of the defined types were placed in this encounter.     Procedures: Large Joint Inj: L knee on 05/31/2023 5:39 PM Details: 22 G needle Medications: 2 mL bupivacaine 0.5 %; 2 mL lidocaine 1 %; 40 mg methylPREDNISolone acetate 40 MG/ML Outcome: tolerated well, no immediate complications Patient was prepped and draped in the usual sterile fashion.       Clinical Data: No additional findings.   Subjective: Chief Complaint  Patient presents with   Left Knee - Follow-up    MRI review    HPI Andre Reynolds is a 42 year old gentleman here to review recent left knee MRI scan. Review of Systems  Constitutional: Negative.   HENT: Negative.    Eyes: Negative.   Respiratory: Negative.    Cardiovascular: Negative.   Gastrointestinal: Negative.   Endocrine: Negative.   Genitourinary: Negative.   Skin: Negative.   Allergic/Immunologic: Negative.   Neurological: Negative.   Hematological: Negative.   Psychiatric/Behavioral: Negative.    All other systems reviewed and are negative.    Objective: Vital Signs: There were no vitals taken for this visit.  Physical Exam Vitals and nursing note reviewed.  Constitutional:      Appearance: He is  well-developed.  Pulmonary:     Effort: Pulmonary effort is normal.  Abdominal:     Palpations: Abdomen is soft.  Skin:    General: Skin is warm.  Neurological:     Mental Status: He is alert and oriented to person, place, and time.  Psychiatric:        Behavior: Behavior normal.        Thought Content: Thought content normal.        Judgment: Judgment normal.     Ortho Exam Exam of the left knee is unchanged. Specialty Comments:  No specialty comments available.  Imaging: No results found.   PMFS History: Patient Active Problem List   Diagnosis Date Noted   Nausea and vomiting 06/29/2022   Hyperlipidemia associated with type 2 diabetes mellitus (HCC) 09/01/2020   Spondylolisthesis, lumbar region 05/12/2020   Close exposure to COVID-19 virus 05/15/2019   Need for immunization against influenza 02/12/2018   Straining with stools 02/12/2018   Diverticulitis    Sepsis (HCC)    Microscopic hematuria 07/26/2017   Type 2 diabetes mellitus (HCC) 07/13/2017   Essential hypertension 07/04/2017   Severe obesity (BMI >= 40) (HCC) 02/19/2017   Diverticulosis of colon 02/19/2017   Tobacco use disorder 02/19/2017   Meralgia paresthetica, left 06/07/2016   Past Medical History:  Diagnosis Date   Asthma    Diabetes  mellitus (HCC)    DIVERTICULITIS, HX OF 08/03/2009   Qualifier: Diagnosis of  By: Gilford Rile MD, Luisa Hart     Hypertension    Pneumonia     Family History  Problem Relation Age of Onset   Diabetes Mother    Alcohol abuse Mother    Asthma Mother    Hypertension Mother    Breast cancer Mother    Diabetes Father    Drug abuse Sister    Diabetes Maternal Grandmother    Diabetes Maternal Aunt    Diabetes Cousin        maternal cousin    Past Surgical History:  Procedure Laterality Date   WISDOM TOOTH EXTRACTION     WRIST SURGERY     Social History   Occupational History   Occupation: not working  Tobacco Use   Smoking status: Every Day    Current  packs/day: 0.50    Average packs/day: 0.5 packs/day for 20.0 years (10.0 ttl pk-yrs)    Types: Cigarettes   Smokeless tobacco: Never  Vaping Use   Vaping status: Never Used  Substance and Sexual Activity   Alcohol use: Yes    Comment: 2-3 beer socially   Drug use: Yes    Types: Marijuana   Sexual activity: Yes    Partners: Female

## 2023-05-31 ENCOUNTER — Encounter: Payer: Self-pay | Admitting: Orthopaedic Surgery

## 2023-05-31 ENCOUNTER — Ambulatory Visit: Payer: Medicaid Other | Admitting: Orthopaedic Surgery

## 2023-05-31 DIAGNOSIS — Z6841 Body Mass Index (BMI) 40.0 and over, adult: Secondary | ICD-10-CM | POA: Diagnosis not present

## 2023-05-31 DIAGNOSIS — M25562 Pain in left knee: Secondary | ICD-10-CM

## 2023-05-31 MED ORDER — BUPIVACAINE HCL 0.5 % IJ SOLN
2.0000 mL | INTRAMUSCULAR | Status: AC | PRN
  Administered 2023-05-31: 2 mL via INTRA_ARTICULAR

## 2023-05-31 MED ORDER — LIDOCAINE HCL 1 % IJ SOLN
2.0000 mL | INTRAMUSCULAR | Status: AC | PRN
  Administered 2023-05-31: 2 mL

## 2023-05-31 MED ORDER — METHYLPREDNISOLONE ACETATE 40 MG/ML IJ SUSP
40.0000 mg | INTRAMUSCULAR | Status: AC | PRN
  Administered 2023-05-31: 40 mg via INTRA_ARTICULAR

## 2023-06-04 ENCOUNTER — Telehealth: Payer: Self-pay | Admitting: Orthopaedic Surgery

## 2023-06-04 MED ORDER — TRAMADOL HCL 50 MG PO TABS: 50.0000 mg | ORAL_TABLET | Freq: Two times a day (BID) | ORAL | 0 refills | Status: DC | PRN

## 2023-06-04 NOTE — Telephone Encounter (Signed)
Pt called in stating the cortisone injection he got in his left knee 12/19 has made his knee pain increase and he was wondering if he could get something called in for thew pain please advise

## 2023-06-04 NOTE — Addendum Note (Signed)
Addended by: Mayra Reel on: 06/04/2023 01:13 PM   Modules accepted: Orders

## 2023-06-04 NOTE — Telephone Encounter (Signed)
Sent tramadol

## 2023-06-04 NOTE — Telephone Encounter (Signed)
Called and notified patient.

## 2023-07-04 ENCOUNTER — Encounter: Payer: Medicaid Other | Admitting: Student

## 2023-07-04 NOTE — Progress Notes (Deleted)
    SUBJECTIVE:   Chief compliant/HPI: annual examination  Andre Reynolds is a 43 y.o. who presents today for an annual exam.   History tabs reviewed and updated ***.   Review of systems form reviewed and notable for ***.   OBJECTIVE:   There were no vitals taken for this visit.  ***  ASSESSMENT/PLAN:   No problem-specific Assessment & Plan notes found for this encounter.    Annual Examination  See AVS for recommendations.  PHQ score ***, reviewed.  Blood pressure value is *** goal.  Advanced directives ***   Considered the following screening exams based upon USPSTF recommendations: Diabetes screening: discussed and ordered Screening for elevated cholesterol: discussed and ordered HIV testing: {discussed/ordered:14545} Hepatitis C: discussed Neg 09/01/20 Hepatitis B: discussed and ordered Syphilis if at high risk: {discussed/ordered:14545} Reviewed risk factors for latent tuberculosis and {not indicated/requested/declined:14582} Colorectal cancer screening: not applicable given age.  if age 55 or over or risk factors.  Immunizations ***.   Follow up in 1 *** year or sooner if indicated.    Bess Kinds, MD [facility]

## 2023-07-12 ENCOUNTER — Encounter: Payer: Self-pay | Admitting: Student

## 2023-07-12 ENCOUNTER — Ambulatory Visit: Payer: Medicaid Other | Admitting: Student

## 2023-07-12 VITALS — BP 141/95 | HR 91 | Ht 68.0 in | Wt 296.6 lb

## 2023-07-12 DIAGNOSIS — E785 Hyperlipidemia, unspecified: Secondary | ICD-10-CM | POA: Diagnosis not present

## 2023-07-12 DIAGNOSIS — E119 Type 2 diabetes mellitus without complications: Secondary | ICD-10-CM

## 2023-07-12 DIAGNOSIS — R0683 Snoring: Secondary | ICD-10-CM | POA: Diagnosis not present

## 2023-07-12 DIAGNOSIS — F320 Major depressive disorder, single episode, mild: Secondary | ICD-10-CM | POA: Insufficient documentation

## 2023-07-12 DIAGNOSIS — Z1159 Encounter for screening for other viral diseases: Secondary | ICD-10-CM | POA: Diagnosis not present

## 2023-07-12 DIAGNOSIS — Z202 Contact with and (suspected) exposure to infections with a predominantly sexual mode of transmission: Secondary | ICD-10-CM

## 2023-07-12 DIAGNOSIS — E1169 Type 2 diabetes mellitus with other specified complication: Secondary | ICD-10-CM

## 2023-07-12 DIAGNOSIS — I10 Essential (primary) hypertension: Secondary | ICD-10-CM | POA: Diagnosis not present

## 2023-07-12 DIAGNOSIS — Z23 Encounter for immunization: Secondary | ICD-10-CM

## 2023-07-12 MED ORDER — ATORVASTATIN CALCIUM 40 MG PO TABS: 40.0000 mg | ORAL_TABLET | Freq: Every day | ORAL | 3 refills | Status: AC

## 2023-07-12 MED ORDER — AMLODIPINE BESYLATE 10 MG PO TABS: 10.0000 mg | ORAL_TABLET | Freq: Every day | ORAL | 3 refills | Status: AC

## 2023-07-12 MED ORDER — METRONIDAZOLE 500 MG PO TABS: 2000.0000 mg | ORAL_TABLET | Freq: Once | ORAL | 0 refills | Status: AC

## 2023-07-12 MED ORDER — BLOOD GLUCOSE MONITOR KIT: PACK | 0 refills | Status: AC

## 2023-07-12 MED ORDER — BLOOD GLUCOSE MONITORING SUPPL DEVI: 1.0000 | Freq: Three times a day (TID) | 0 refills | Status: DC

## 2023-07-12 MED ORDER — LANCET DEVICE MISC: 1.0000 | Freq: Three times a day (TID) | 10 refills | Status: AC

## 2023-07-12 MED ORDER — METFORMIN HCL ER 500 MG PO TB24: 1000.0000 mg | ORAL_TABLET | Freq: Two times a day (BID) | ORAL | 4 refills | Status: AC

## 2023-07-12 MED ORDER — BLOOD GLUCOSE TEST VI STRP: 1.0000 | ORAL_STRIP | Freq: Three times a day (TID) | 10 refills | Status: AC

## 2023-07-12 MED ORDER — LANCETS MISC. MISC: 1.0000 | Freq: Three times a day (TID) | 10 refills | Status: AC

## 2023-07-12 NOTE — Progress Notes (Signed)
SUBJECTIVE:   Chief compliant/HPI: annual examination  Andre Reynolds is a 43 y.o. who presents today for an annual exam.   OSA Snores in his sleep and stops breathing for long/extended periods. Wife has noticed. Get's poor quality sleep.   OBJECTIVE:   BP (!) 141/95   Pulse 91   Ht 5\' 8"  (1.727 m)   Wt 296 lb 9.6 oz (134.5 kg)   SpO2 100%   BMI 45.10 kg/m   Physical Exam Constitutional:      General: He is not in acute distress.    Appearance: Normal appearance. He is not ill-appearing.  Cardiovascular:     Rate and Rhythm: Normal rate and regular rhythm.     Pulses: Normal pulses.     Heart sounds: Normal heart sounds. No murmur heard.    No friction rub. No gallop.  Pulmonary:     Effort: Pulmonary effort is normal. No respiratory distress.     Breath sounds: Normal breath sounds. No stridor. No wheezing, rhonchi or rales.  Chest:     Chest wall: No tenderness.  Abdominal:     General: Bowel sounds are normal. There is no distension.     Palpations: Abdomen is soft. There is no mass.     Tenderness: There is no abdominal tenderness. There is no guarding or rebound.     Hernia: No hernia is present.  Skin:    Capillary Refill: Capillary refill takes less than 2 seconds.  Neurological:     Mental Status: He is alert.  Psychiatric:        Mood and Affect: Mood normal.        Behavior: Behavior normal.      ASSESSMENT/PLAN:   Essential hypertension Patient comes for follow-up of his hypertension.  Patient has not been taking his medication for nearly a year.  Patient reports he has been out of it.  Blood pressure slightly elevated, will restart amlodipine and have patient follow-up. - Restart amlodipine 10 daily - Follow-up 2 weeks for blood pressure recheck  Type 2 diabetes mellitus (HCC) Patient comes in for follow-up of his diabetes.  Has been taking metformin regularly.  Needs refill today. - A1c - Continue metformin  Depression, major, single  episode, mild (HCC) Patient comes in with concerns for depression.  Wife appreciating patient has periods of irritability, poor sleep, and periods of increased impulsiveness.  Patient reports a lot of social stressors, and feeling down/sad 2 days out of the week regularly.  Patient would like to start medication, but screen concerning for mania.  Will refer to psychiatry for evaluation and treatment. - Psychiatry resources provided - Follow-up psychiatry  Loud snoring Patient is wife noticing that patient snores loudly during the night, and also stops breathing for long periods during the night.  I told him this was concerning for sleep apnea.  They would like at home sleep study. - At home sleep study referral placed  Trichomonas contact Patient's wife recently tested for sexually transmitted infections, found that she has trichomonas.  Patient believes this came from him, and would like treatment.  Patient denies any symptoms, declines exam.  Will treat presumptively for trichomonas. - Metronidazole 2 g x 1    Annual Examination  See AVS for recommendations.  PHQ score not done, but mood is okay, but sometimes getting down/sad 2 x in a week. Has symptoms concerning for mania (impulsiveness, irritability, poor sleep).  Blood pressure value is not at goal, not taking  any more, need's a refill.   Considered the following screening exams based upon USPSTF recommendations: Diabetes screening: discussed and ordered, needed refill of metformin Screening for elevated cholesterol: discussed and ordered HIV testing: discussed and ordered Hepatitis C: discussed Neg 09/01/20 Hepatitis B: discussed and ordered Syphilis if at high risk: discussed doe not want testing Trichomonas: Wife was dx and tx for Trich, wants tx today. She has been treated, have not been sexually active since.   Reviewed risk factors for latent tuberculosis and not indicated Colorectal cancer screening: not applicable given  age.  if age 13 or over or risk factors.  Immunizations Flu and PCV given.   Follow up in 1  year or sooner if indicated.    Bess Kinds, MD [facility]

## 2023-07-12 NOTE — Assessment & Plan Note (Signed)
Patient comes in for follow-up of his diabetes.  Has been taking metformin regularly.  Needs refill today. - A1c - Continue metformin

## 2023-07-12 NOTE — Assessment & Plan Note (Signed)
Patient's wife recently tested for sexually transmitted infections, found that she has trichomonas.  Patient believes this came from him, and would like treatment.  Patient denies any symptoms, declines exam.  Will treat presumptively for trichomonas. - Metronidazole 2 g x 1

## 2023-07-12 NOTE — Assessment & Plan Note (Signed)
Patient comes for follow-up of his hypertension.  Patient has not been taking his medication for nearly a year.  Patient reports he has been out of it.  Blood pressure slightly elevated, will restart amlodipine and have patient follow-up. - Restart amlodipine 10 daily - Follow-up 2 weeks for blood pressure recheck

## 2023-07-12 NOTE — Assessment & Plan Note (Signed)
Patient comes in with concerns for depression.  Wife appreciating patient has periods of irritability, poor sleep, and periods of increased impulsiveness.  Patient reports a lot of social stressors, and feeling down/sad 2 days out of the week regularly.  Patient would like to start medication, but screen concerning for mania.  Will refer to psychiatry for evaluation and treatment. - Psychiatry resources provided - Follow-up psychiatry

## 2023-07-12 NOTE — Patient Instructions (Addendum)
It was great to see you! Thank you for allowing me to participate in your care!  I recommend that you always bring your medications to each appointment as this makes it easy to ensure we are on the correct medications and helps [place] not miss when refills are needed.  Our plans for today:  - Check up  Screening for Hep B, cholesterol, diabetes  - Trichomonas infection  Metronidazole 2000 mg (4 pills) once  No sex for 1 week after treatment  - Snoring  Placed order for at home sleep study to determine if you have Sleep Apnea If you have not heard back in 2 weeks, about the study, call the clinic and ask about the referral  - Depression  List of psychiatrist provided, set up appointment to be seen! (List Below)  - Next year you are due for a colonoscopy  We are checking some labs today, I will call you if they are abnormal will send you a MyChart message or a letter if they are normal.  If you do not hear about your labs in the next 2 weeks please let us know.  Take care and seek immediate care sooner if you develop any concerns.   Dr. Bess Kinds, MD [facility]   Psychiatry Resource List (Adults and Children) Most of these providers will take Medicaid. please consult your insurance for a complete and updated list of available providers. When calling to make an appointment have your insurance information available to confirm you are covered.   [facility]Psychiatry and Counseling [address] [place], [place] [address] [phone]  [facility]  [address] [place], [place] [phone] Crisis [phone]   [facility]:   [facility]: [address]     [phone]   [facility]: [address] [place]. [place],        [phone] [place]: [phone],    [phone] [place]: [phone] [address],                   [phone] Children: [facility] [address]         [phone]  [facility] (virtual only) [phone]   [facility]  (Psychiatry only; Adults /children 12 and over, will take Medicaid)  [address] Laurell Josephs [address], [place], [place] [address]       [phone]   [facility] (Psychiatry & counseling ; adults & children ; will take Medicaid [address]  [place] [address]  Go on-line to complete referral ( [email or web address] [phone]    (Spanish speaking therapists)  [facility]  Psychiatry & counseling; Adults and children;  Call Registration prior to scheduling an appointment [phone] [address]    [place], [place] [address]    [phone]  [facility] (Psychiatry & counseling; adults & children; Medicare no Medicaid)  [address]   [place], [place]  [address]      [phone]    [facility] (up to age 65)  Psychiatry & counseling ,will take Medicaid, must do counseling to receive psychiatry services  [address] [place] [address]        [phone]  [facility] (Psychiatry & counseling; adults & children; will take Medicaid) Will need a referral from provider [address],  [place],  [place]  [phone]   [facility] --- Walk-In Mon-Friday 8am-3pm ( will take Medicaid, Psychiatry, Adults & children,  [address], [place], [place]   [phone]   [facility] [place], Walk-in M-F 8am-12pm and 1pm -3pm   (Counseling, Psychiatry, will take Medicaid, adults & children)  [address] [place], [place]  [phone]

## 2023-07-12 NOTE — Assessment & Plan Note (Signed)
Patient is wife noticing that patient snores loudly during the night, and also stops breathing for long periods during the night.  I told him this was concerning for sleep apnea.  They would like at home sleep study. - At home sleep study referral placed

## 2023-08-15 ENCOUNTER — Other Ambulatory Visit: Payer: Self-pay | Admitting: Student

## 2023-08-15 DIAGNOSIS — R0683 Snoring: Secondary | ICD-10-CM

## 2023-09-01 ENCOUNTER — Other Ambulatory Visit: Payer: Self-pay | Admitting: Student

## 2023-10-23 ENCOUNTER — Ambulatory Visit: Admitting: Physician Assistant

## 2023-10-23 DIAGNOSIS — M1712 Unilateral primary osteoarthritis, left knee: Secondary | ICD-10-CM | POA: Diagnosis not present

## 2023-10-23 MED ORDER — TRAMADOL HCL 50 MG PO TABS
50.0000 mg | ORAL_TABLET | Freq: Two times a day (BID) | ORAL | 0 refills | Status: DC | PRN
Start: 2023-10-23 — End: 2024-01-10

## 2023-10-23 MED ORDER — BUPIVACAINE HCL 0.25 % IJ SOLN
2.0000 mL | INTRAMUSCULAR | Status: AC | PRN
  Administered 2023-10-23: 2 mL via INTRA_ARTICULAR

## 2023-10-23 MED ORDER — LIDOCAINE HCL 1 % IJ SOLN
2.0000 mL | INTRAMUSCULAR | Status: AC | PRN
  Administered 2023-10-23: 2 mL

## 2023-10-23 MED ORDER — METHYLPREDNISOLONE ACETATE 40 MG/ML IJ SUSP
40.0000 mg | INTRAMUSCULAR | Status: AC | PRN
  Administered 2023-10-23: 40 mg via INTRA_ARTICULAR

## 2023-10-23 NOTE — Progress Notes (Signed)
 Office Visit Note   Patient: Andre Reynolds           Date of Birth: [birth year]           MRN: [id number] Visit Date: 10/23/2023              Requested by: Rayma Calandra, DO [address] [place],  [place] [address] PCP: Rayma Calandra, DO   Assessment & Plan: Visit Diagnoses:  1. Unilateral primary osteoarthritis, left knee     Plan: Impression is left knee degenerative joint disease primarily lateral compartment.  We have discussed various treatment options to include repeat cortisone injection versus viscosupplementation injection.  He tells me his wife has the same insurance and she was not approved for viscosupplementation injection for her knee that he is not interested in trying to get approval.  He would like to proceed with cortisone injection today.  He will follow-up with us  as needed.  Follow-Up Instructions: No follow-ups on file.   Orders:  Orders Placed This Encounter  Procedures   Large Joint Inj   Meds ordered this encounter  Medications   traMADol  (ULTRAM ) 50 MG tablet    Sig: Take 1 tablet (50 mg total) by mouth 2 (two) times daily as needed.    Dispense:  20 tablet    Refill:  0      Procedures: Large Joint Inj: L knee on 10/23/2023 3:31 PM Indications: pain Details: 22 G needle, anterolateral approach Medications: 2 mL lidocaine  1 %; 2 mL bupivacaine  0.25 %; 40 mg methylPREDNISolone  acetate 40 MG/ML      Clinical Data: No additional findings.   Subjective: Chief Complaint  Patient presents with   Left Knee - Pain    HPI patient is a 43 year old gentleman who comes in today with recurrent left knee pain.  He was seen by us  in December of this past year following MRI of the left knee.  MRI showed moderate degenerative changes to the lateral compartment.  Cortisone injection was performed which helped but only lasted for about a week.  Symptoms have returned.  The pain he has is constant with associated swelling.  Pain is worse with  activity as well as when he is walking too long distance.  He does not take medication for this.  He does take occasional tramadol  which helps at times.  He has never undergone viscosupplementation injection, but does note his wife has the same insurance and was not approved for her knee.  Review of Systems as detailed in HPI.  All others reviewed and are negative.   Objective: Vital Signs: There were no vitals taken for this visit.  Physical Exam well-developed well-nourished gentleman in no acute distress.  Alert and oriented x 3.  Ortho Exam left knee exam: Small effusion.  Range of motion 0 to 120 degrees.  Medial and lateral joint line tenderness.  He is neurovascularly intact distally.  Specialty Comments:  No specialty comments available.  Imaging: No new imaging   PMFS History: Patient Active Problem List   Diagnosis Date Noted   Depression, major, single episode, mild (HCC) 07/12/2023   Loud snoring 07/12/2023   Trichomonas contact 07/12/2023   Nausea and vomiting 06/29/2022   Hyperlipidemia associated with type 2 diabetes mellitus (HCC) 09/01/2020   Spondylolisthesis, lumbar region 05/12/2020   Close exposure to COVID-19 virus 05/15/2019   Need for immunization against influenza 02/12/2018   Straining with stools 02/12/2018   Diverticulitis    Sepsis (HCC)  Microscopic hematuria 07/26/2017   Type 2 diabetes mellitus (HCC) 07/13/2017   Essential hypertension 07/04/2017   Severe obesity (BMI >= 40) (HCC) 02/19/2017   Diverticulosis of colon 02/19/2017   Tobacco use disorder 02/19/2017   Meralgia paresthetica, left 06/07/2016   Past Medical History:  Diagnosis Date   Asthma    Diabetes mellitus (HCC)    DIVERTICULITIS, HX OF 08/03/2009   Qualifier: Diagnosis of  By: Margorie Shelter MD, Portia Brittle     Hypertension    Pneumonia     Family History  Problem Relation Age of Onset   Diabetes Mother    Alcohol abuse Mother    Asthma Mother    Hypertension Mother    Breast  cancer Mother    Diabetes Father    Drug abuse Sister    Diabetes Maternal Grandmother    Diabetes Maternal Aunt    Diabetes Cousin        maternal cousin    Past Surgical History:  Procedure Laterality Date   WISDOM TOOTH EXTRACTION     WRIST SURGERY     Social History   Occupational History   Occupation: not working  Tobacco Use   Smoking status: Every Day    Current packs/day: 0.50    Average packs/day: 0.5 packs/day for 20.0 years (10.0 ttl pk-yrs)    Types: Cigarettes   Smokeless tobacco: Never  Vaping Use   Vaping status: Never Used  Substance and Sexual Activity   Alcohol use: Yes    Comment: 2-3 beer socially   Drug use: Yes    Types: Marijuana   Sexual activity: Yes    Partners: Female

## 2023-11-01 ENCOUNTER — Other Ambulatory Visit: Payer: Self-pay | Admitting: Student

## 2023-11-01 DIAGNOSIS — R0683 Snoring: Secondary | ICD-10-CM

## 2023-11-09 ENCOUNTER — Other Ambulatory Visit: Payer: Self-pay | Admitting: Student

## 2023-11-09 DIAGNOSIS — R0683 Snoring: Secondary | ICD-10-CM

## 2023-11-20 ENCOUNTER — Ambulatory Visit (HOSPITAL_BASED_OUTPATIENT_CLINIC_OR_DEPARTMENT_OTHER): Attending: Family Medicine | Admitting: Internal Medicine

## 2023-11-20 DIAGNOSIS — R0683 Snoring: Secondary | ICD-10-CM | POA: Diagnosis present

## 2023-11-20 DIAGNOSIS — G4733 Obstructive sleep apnea (adult) (pediatric): Secondary | ICD-10-CM | POA: Insufficient documentation

## 2023-12-01 DIAGNOSIS — R0683 Snoring: Secondary | ICD-10-CM | POA: Diagnosis not present

## 2023-12-01 NOTE — Procedures (Signed)
 [facility] [address] [place], [place] [address] Tel: [phone]   Fax: [phone]  Home Sleep Test Interpretation  Patient Name: Andre Reynolds, Andre Reynolds Date: 11/21/2023  Date of Birth: [birth year] Study Type: HST  Age: 43 year MRN #: [id number]  Sex: Male Interpreting Physician: NEYSA RAMA [id number]  Height: 5' 8 Referring Physician: Penne Rhein, MD  Weight: 296.0 lbs Recording Tech: Holly Neeriemer RPSGT RST  BMI: 45.4 Scoring Tech: Will Poet RRT RPSGT RST  ESS: - Neck Size: -   Indications for Polysomnography The patient is a 43 year-old Male who is 5' 8 and weighs 296.0 lbs. His BMI equals 45.4.  A home sleep apnea test was performed to evaluate for -snoring.  Medication  No Data.   Polysomnogram Data A home sleep test recorded the standard physiologic parameters including EKG, nasal and oral airflow.  Respiratory parameters of chest and abdominal movements were recorded with Respiratory Inductance Plethysmography belts.  Oxygen saturation was recorded by pulse oximetry.   Study Architecture The total recording time of the polysomnogram was 403.7 minutes.  The total monitoring time was 404.0 minutes.  Time spent in Supine position was 31.0 minutes.   Respiratory Events The study revealed a presence of 14 obstructive, - central, and 1 mixed apneas resulting in an Apnea index of 2.2 events per hour.  There were 117 hypopneas (>=3% desaturation and/or arousal) resulting in an Apnea\Hypopnea Index (AHI >=3% desaturation and/or arousal) of 19.6 events per hour.  There were 95 hypopneas (>=4% desaturation) resulting in an Apnea\Hypopnea Index (AHI >=4% desaturation) of 16.3 events per hour.  There were - Respiratory Effort Related Arousals resulting in a RERA index of - events per hour. The Respiratory Disturbance Index is 19.6 events per hour.  The snore index was - events per hour.  Mean oxygen saturation was 95.5%.  The  lowest oxygen saturation during monitoring time was 74.0%.  Time spent <=88% oxygen saturation was 23.1 minutes (5.8%).  Cardiac Summary The average pulse rate was 73.2 bpm.  The minimum pulse rate was 46.0 bpm while the maximum pulse rate was 117.0 bpm.  Cardiac rhythm was normal.  Comment: Moderate obstructive sleep apnea, AHI (3%) 19.6/hr. Snoring with oxygen desaturation to a nadir of 74%, mean 95.5%.  Diagnosis: Obstructive sleep apnea  Recommendations: Autopap or CPAP titration sleep study   This study was personally reviewed and electronically signed by: RAMA NEYSA, MD Accredited Board Certified in Sleep Medicine Date/Time: 12/01/23   12:16   Study Overview  Recording Time: 414.8 min. Monitoring Time: 404.0 min.  Analysis Start:  12:49:51 AM Supine Time: 31.0 min.  Analysis Stop:  07:33:32 AM     Study Summary   Count Index Longest Event Duration  Apneas & Hypopneas: 132 19.6  Apneas: 64.0 sec.     Hypopneas: 106.6 sec.  RERAs: - - - sec.  Desaturations: 126 18.7 108.9 sec.  Snores: - - - sec.    Minimum Oxygen Saturation: 74.0%    Respiratory Summary   Total Duration Supine Non-Supine   Count Index Average Longest Count Index Count Index  Obstructive Apnea 14 2.1 21.7 64.0 9 17.4 5 0.8   Mixed Apnea 1 0.1 22.3 22.3 1 1.9 - -   Central Apnea - - - - - - - -   Total Apneas 15 2.2 21.7 64.0 10 19.4 5 0.8            Hypopneas 3% 117 17.4 N.A.  N.A. 25 48.4 92 14.8   Apneas & Hyp. 3% 132 19.6 N.A. N.A. 35 67.7 97 15.6            Hypopneas 4% 95 14.1 N.A. N.A. 24 46.5 71 11.4  Apneas & Hyp. 4% 110 16.3 N.A. N.A. 34 65.8 76 12.2             RERAs - - - - - - - -  RDI 134 19.9 N.A. N.A. 35 67.7 99 15.9   Oxygen Saturation Summary   Total Supine Non-Supine  Average SpO2 95.5% 93.1% 95.7%  Minimum SpO2 74.0% 74.0% 79.0%   Maximum SpO2 100.0% 99.0% 100.0%   Oxygen Saturation Distribution  Range (%) Time in range (min) Time in range (%)  90.0 - 100.0 369.1  91.8%  80.0 - 90.0 30.1 7.5%  70.0 - 80.0 2.7 0.7%  60.0 - 70.0 - -  50.0 - 60.0 - -  0.0 - 50.0 - -  Time Spent <=88% SpO2  Range (%) Time in range (min) Time in range (%)  0.0 - 88.0 23.1 5.8%  Cardiac Summary   Total Supine Non-Supine  Average Pulse Rate (BPM) 73.2 73.8 73.1  Minimum Pulse Rate (BPM) 46.0 53.0 46.0  Maximum Pulse Rate (BPM) 117.0 107.0 117.0                      Technologist Comments  -                        Reggy Neysa Bateman, Biomedical engineer of Sleep Medicine  ELECTRONICALLY SIGNED ON:  12/01/2023, 12:13 PM [facility] PH: [phone]   FX: [phone] ACCREDITED BY THE AMERICAN ACADEMY OF SLEEP MEDICINE

## 2023-12-03 DIAGNOSIS — M25562 Pain in left knee: Secondary | ICD-10-CM | POA: Diagnosis not present

## 2023-12-12 DIAGNOSIS — S83282A Other tear of lateral meniscus, current injury, left knee, initial encounter: Secondary | ICD-10-CM | POA: Diagnosis not present

## 2023-12-12 DIAGNOSIS — M79672 Pain in left foot: Secondary | ICD-10-CM | POA: Diagnosis not present

## 2023-12-13 ENCOUNTER — Telehealth: Payer: Self-pay

## 2023-12-13 ENCOUNTER — Other Ambulatory Visit: Payer: Self-pay | Admitting: Orthopedic Surgery

## 2023-12-13 NOTE — Telephone Encounter (Signed)
 Patients wife calls nurse line in regards to sleep study results.   Sleep study report is availability for viewing.   Will forward to provider who saw patient for concern.

## 2023-12-19 NOTE — Telephone Encounter (Signed)
 Pt called and discussed that we are still waiting on results. Recommend calling about results by end of month, if not yet heard back.  -BS

## 2023-12-24 ENCOUNTER — Telehealth: Payer: Self-pay | Admitting: Family Medicine

## 2023-12-24 ENCOUNTER — Telehealth: Payer: Self-pay | Admitting: *Deleted

## 2023-12-24 DIAGNOSIS — G4733 Obstructive sleep apnea (adult) (pediatric): Secondary | ICD-10-CM

## 2023-12-24 NOTE — Telephone Encounter (Signed)
 Pt calling in wanting to know results of sleep study. Please advise. Woodford Strege [place], CMA

## 2023-12-24 NOTE — Telephone Encounter (Signed)
 Called patient at number listed in chart, patient's wife, Chemika answered. After confirming name and DOB of the patient, discussed result of sleep study on 6/10. Patient does have diagnosis of sleep apnea, and will require CPAP with autotitration.

## 2023-12-26 DIAGNOSIS — M79672 Pain in left foot: Secondary | ICD-10-CM | POA: Diagnosis not present

## 2024-01-03 ENCOUNTER — Other Ambulatory Visit: Payer: Self-pay

## 2024-01-03 ENCOUNTER — Encounter (HOSPITAL_BASED_OUTPATIENT_CLINIC_OR_DEPARTMENT_OTHER): Payer: Self-pay | Admitting: Orthopedic Surgery

## 2024-01-07 DIAGNOSIS — M79672 Pain in left foot: Secondary | ICD-10-CM | POA: Diagnosis not present

## 2024-01-08 ENCOUNTER — Encounter (HOSPITAL_BASED_OUTPATIENT_CLINIC_OR_DEPARTMENT_OTHER)
Admission: RE | Admit: 2024-01-08 | Discharge: 2024-01-08 | Disposition: A | Discharge status: Home / Self Care | Source: Ambulatory Visit | Attending: Orthopedic Surgery

## 2024-01-08 ENCOUNTER — Other Ambulatory Visit: Payer: Self-pay

## 2024-01-08 DIAGNOSIS — E119 Type 2 diabetes mellitus without complications: Secondary | ICD-10-CM | POA: Insufficient documentation

## 2024-01-08 DIAGNOSIS — Z01812 Encounter for preprocedural laboratory examination: Secondary | ICD-10-CM | POA: Diagnosis present

## 2024-01-08 DIAGNOSIS — Z01818 Encounter for other preprocedural examination: Secondary | ICD-10-CM | POA: Diagnosis not present

## 2024-01-08 DIAGNOSIS — Z01810 Encounter for preprocedural cardiovascular examination: Secondary | ICD-10-CM | POA: Diagnosis present

## 2024-01-08 LAB — BASIC METABOLIC PANEL WITH GFR
Anion gap: 7 (ref 5–15)
BUN: 7 mg/dL (ref 6–20)
CO2: 24 mmol/L (ref 22–32)
Calcium: 8.8 mg/dL — ABNORMAL LOW (ref 8.9–10.3)
Chloride: 105 mmol/L (ref 98–111)
Creatinine, Ser: 0.84 mg/dL (ref 0.61–1.24)
GFR, Estimated: >60 mL/min (ref >60)
Glucose, Bld: 235 mg/dL — ABNORMAL HIGH (ref 70–99)
Potassium: 4.2 mmol/L (ref 3.5–5.1)
Sodium: 136 mmol/L (ref 135–145)

## 2024-01-08 NOTE — Progress Notes (Signed)

## 2024-01-09 DIAGNOSIS — M79672 Pain in left foot: Secondary | ICD-10-CM | POA: Diagnosis not present

## 2024-01-10 ENCOUNTER — Other Ambulatory Visit: Payer: Self-pay

## 2024-01-10 ENCOUNTER — Ambulatory Visit (HOSPITAL_BASED_OUTPATIENT_CLINIC_OR_DEPARTMENT_OTHER): Admitting: Anesthesiology

## 2024-01-10 ENCOUNTER — Encounter (HOSPITAL_BASED_OUTPATIENT_CLINIC_OR_DEPARTMENT_OTHER)
Admission: RE | Disposition: A | Discharge status: Home / Self Care | Payer: Self-pay | Source: Home / Self Care | Attending: Orthopedic Surgery

## 2024-01-10 ENCOUNTER — Encounter (HOSPITAL_BASED_OUTPATIENT_CLINIC_OR_DEPARTMENT_OTHER): Payer: Self-pay | Admitting: Orthopedic Surgery

## 2024-01-10 ENCOUNTER — Ambulatory Visit (HOSPITAL_BASED_OUTPATIENT_CLINIC_OR_DEPARTMENT_OTHER)
Admission: RE | Admit: 2024-01-10 | Discharge: 2024-01-10 | Disposition: A | Discharge status: Home / Self Care | Attending: Orthopedic Surgery | Admitting: Orthopedic Surgery

## 2024-01-10 DIAGNOSIS — J45909 Unspecified asthma, uncomplicated: Secondary | ICD-10-CM | POA: Insufficient documentation

## 2024-01-10 DIAGNOSIS — M1712 Unilateral primary osteoarthritis, left knee: Secondary | ICD-10-CM

## 2024-01-10 DIAGNOSIS — I10 Essential (primary) hypertension: Secondary | ICD-10-CM | POA: Diagnosis not present

## 2024-01-10 DIAGNOSIS — F32A Depression, unspecified: Secondary | ICD-10-CM | POA: Diagnosis not present

## 2024-01-10 DIAGNOSIS — X58XXXA Exposure to other specified factors, initial encounter: Secondary | ICD-10-CM | POA: Insufficient documentation

## 2024-01-10 DIAGNOSIS — S83282A Other tear of lateral meniscus, current injury, left knee, initial encounter: Secondary | ICD-10-CM | POA: Insufficient documentation

## 2024-01-10 DIAGNOSIS — G4730 Sleep apnea, unspecified: Secondary | ICD-10-CM | POA: Insufficient documentation

## 2024-01-10 DIAGNOSIS — F17210 Nicotine dependence, cigarettes, uncomplicated: Secondary | ICD-10-CM

## 2024-01-10 DIAGNOSIS — S83242A Other tear of medial meniscus, current injury, left knee, initial encounter: Secondary | ICD-10-CM

## 2024-01-10 DIAGNOSIS — Z7984 Long term (current) use of oral hypoglycemic drugs: Secondary | ICD-10-CM | POA: Diagnosis not present

## 2024-01-10 DIAGNOSIS — E119 Type 2 diabetes mellitus without complications: Secondary | ICD-10-CM | POA: Insufficient documentation

## 2024-01-10 HISTORY — PX: KNEE ARTHROSCOPY WITH LATERAL MENISECTOMY: SHX6193

## 2024-01-10 HISTORY — DX: Depression, unspecified: F32.A

## 2024-01-10 HISTORY — DX: Sleep apnea, unspecified: G47.30

## 2024-01-10 LAB — GLUCOSE, CAPILLARY
Glucose-Capillary: 207 mg/dL — ABNORMAL HIGH (ref 70–99)
Glucose-Capillary: 167 mg/dL — ABNORMAL HIGH (ref 70–99)

## 2024-01-10 SURGERY — ARTHROSCOPY, KNEE, WITH LATERAL MENISCECTOMY
Anesthesia: General | Site: Knee | Laterality: Left

## 2024-01-10 MED ORDER — BUPIVACAINE HCL (PF) 0.25 % IJ SOLN
INTRAMUSCULAR | Status: DC | PRN
  Administered 2024-01-10: 20 mL

## 2024-01-10 MED ORDER — PROPOFOL 10 MG/ML IV BOLUS
INTRAVENOUS | Status: DC | PRN
Start: 2024-01-10 — End: 2024-01-10
  Administered 2024-01-10: 300 ug via INTRAVENOUS

## 2024-01-10 MED ORDER — FENTANYL CITRATE (PF) 100 MCG/2ML IJ SOLN
INTRAMUSCULAR | Status: AC
Start: 2024-01-10 — End: 2024-01-10
  Filled 2024-01-10: qty 2

## 2024-01-10 MED ORDER — LACTATED RINGERS IV SOLN: INTRAVENOUS | Status: DC | PRN

## 2024-01-10 MED ORDER — CEFAZOLIN SODIUM-DEXTROSE 3-4 GM/150ML-% IV SOLN
INTRAVENOUS | Status: AC
  Filled 2024-01-10: qty 150

## 2024-01-10 MED ORDER — MIDAZOLAM HCL 2 MG/2ML IJ SOLN
INTRAMUSCULAR | Status: AC
Start: 2024-01-10 — End: 2024-01-10
  Filled 2024-01-10: qty 2

## 2024-01-10 MED ORDER — DROPERIDOL 2.5 MG/ML IJ SOLN: 0.6250 mg | Freq: Once | INTRAMUSCULAR | Status: DC | PRN

## 2024-01-10 MED ORDER — LACTATED RINGERS IV SOLN: INTRAVENOUS | Status: DC

## 2024-01-10 MED ORDER — DEXAMETHASONE SODIUM PHOSPHATE 10 MG/ML IJ SOLN
INTRAMUSCULAR | Status: DC | PRN
  Administered 2024-01-10: 5 mg via INTRAVENOUS

## 2024-01-10 MED ORDER — CEFAZOLIN SODIUM-DEXTROSE 3-4 GM/150ML-% IV SOLN
3.0000 g | INTRAVENOUS | Status: AC
  Administered 2024-01-10: 3 g via INTRAVENOUS

## 2024-01-10 MED ORDER — FENTANYL CITRATE (PF) 100 MCG/2ML IJ SOLN
INTRAMUSCULAR | Status: DC | PRN
  Administered 2024-01-10 (×4): 50 ug via INTRAVENOUS

## 2024-01-10 MED ORDER — OXYCODONE HCL 5 MG PO TABS
ORAL_TABLET | ORAL | Status: AC
Start: 2024-01-10 — End: 2024-01-10
  Filled 2024-01-10: qty 1

## 2024-01-10 MED ORDER — LIDOCAINE 2% (20 MG/ML) 5 ML SYRINGE
INTRAMUSCULAR | Status: DC | PRN
  Administered 2024-01-10: 40 mg via INTRAVENOUS

## 2024-01-10 MED ORDER — FENTANYL CITRATE (PF) 100 MCG/2ML IJ SOLN
25.0000 ug | INTRAMUSCULAR | Status: DC | PRN
  Administered 2024-01-10 (×3): 50 ug via INTRAVENOUS

## 2024-01-10 MED ORDER — ONDANSETRON HCL 4 MG PO TABS: 4.0000 mg | ORAL_TABLET | Freq: Three times a day (TID) | ORAL | 0 refills | Status: AC | PRN

## 2024-01-10 MED ORDER — TRAMADOL HCL 50 MG PO TABS: 50.0000 mg | ORAL_TABLET | Freq: Four times a day (QID) | ORAL | 0 refills | Status: AC | PRN

## 2024-01-10 MED ORDER — MIDAZOLAM HCL 2 MG/2ML IJ SOLN
INTRAMUSCULAR | Status: DC | PRN
  Administered 2024-01-10: 2 mg via INTRAVENOUS

## 2024-01-10 MED ORDER — ONDANSETRON HCL 4 MG/2ML IJ SOLN
INTRAMUSCULAR | Status: DC | PRN
Start: 2024-01-10 — End: 2024-01-10
  Administered 2024-01-10: 4 mg via INTRAVENOUS

## 2024-01-10 MED ORDER — ACETAMINOPHEN 10 MG/ML IV SOLN: 1000.0000 mg | Freq: Once | INTRAVENOUS | Status: DC | PRN

## 2024-01-10 MED ORDER — OXYCODONE HCL 5 MG PO TABS
5.0000 mg | ORAL_TABLET | Freq: Once | ORAL | Status: AC | PRN
  Administered 2024-01-10: 5 mg via ORAL

## 2024-01-10 MED ORDER — SODIUM CHLORIDE 0.9 % IR SOLN
Status: DC | PRN
  Administered 2024-01-10: 3000 mL

## 2024-01-10 MED ORDER — OXYCODONE HCL 5 MG/5ML PO SOLN: 5.0000 mg | Freq: Once | ORAL | Status: AC | PRN

## 2024-01-10 MED ORDER — FENTANYL CITRATE (PF) 100 MCG/2ML IJ SOLN
INTRAMUSCULAR | Status: AC
  Filled 2024-01-10: qty 2

## 2024-01-10 SURGICAL SUPPLY — 35 items
BLADE SHAVER BONE 5.0X13 (MISCELLANEOUS) ×2 IMPLANT
BNDG COHESIVE 4X5 TAN STRL LF (GAUZE/BANDAGES/DRESSINGS) IMPLANT
BNDG COMPR ESMARK 6X3 LF (GAUZE/BANDAGES/DRESSINGS) ×2 IMPLANT
BNDG ELASTIC 6INX 5YD STR LF (GAUZE/BANDAGES/DRESSINGS) ×2 IMPLANT
CHLORAPREP W/TINT 26 (MISCELLANEOUS) ×2 IMPLANT
CLSR STERI-STRIP ANTIMIC 1/2X4 (GAUZE/BANDAGES/DRESSINGS) IMPLANT
CUFF TRNQT CYL 34X4.125X (TOURNIQUET CUFF) IMPLANT
DISSECTOR 4.0MM X 13CM (MISCELLANEOUS) ×2 IMPLANT
DRAPE IMP U-DRAPE 54X76 (DRAPES) IMPLANT
DRAPE U-SHAPE 47X51 STRL (DRAPES) ×2 IMPLANT
DRAPE-T ARTHROSCOPY W/POUCH (DRAPES) ×2 IMPLANT
DRSG EMULSION OIL 3X3 NADH (GAUZE/BANDAGES/DRESSINGS) ×2 IMPLANT
GAUZE SPONGE 4X4 12PLY STRL (GAUZE/BANDAGES/DRESSINGS) ×4 IMPLANT
GLOVE BIO SURGEON STRL SZ7.5 (GLOVE) ×2 IMPLANT
GLOVE BIO SURGEON STRL SZ8 (GLOVE) ×2 IMPLANT
GLOVE BIOGEL PI IND STRL 8 (GLOVE) ×2 IMPLANT
GOWN STRL REUS W/ TWL LRG LVL3 (GOWN DISPOSABLE) ×2 IMPLANT
GOWN STRL REUS W/TWL XL LVL3 (GOWN DISPOSABLE) ×2 IMPLANT
NDL SUT 2-0 SCORPION KNEE (NEEDLE) IMPLANT
NEEDLE SUT 2-0 SCORPION KNEE (NEEDLE) IMPLANT
NS IRRIG 1000ML POUR BTL (IV SOLUTION) ×2 IMPLANT
PACK ARTHROSCOPY DSU (CUSTOM PROCEDURE TRAY) ×2 IMPLANT
PACK BASIN DAY SURGERY FS (CUSTOM PROCEDURE TRAY) ×2 IMPLANT
PAD CAST 4YDX4 CTTN HI CHSV (CAST SUPPLIES) IMPLANT
PORTAL SKID DEVICE (INSTRUMENTS) IMPLANT
SLEEVE SCD COMPRESS KNEE MED (STOCKING) ×2 IMPLANT
SOL .9 NS 3000ML IRR UROMATIC (IV SOLUTION) ×4 IMPLANT
SPIKE FLUID TRANSFER (MISCELLANEOUS) IMPLANT
SUT ETHILON 3 0 PS 1 (SUTURE) ×2 IMPLANT
SUT MNCRL AB 4-0 PS2 18 (SUTURE) IMPLANT
TOWEL GREEN STERILE FF (TOWEL DISPOSABLE) ×2 IMPLANT
TUBE CONNECTING 20X1/4 (TUBING) ×2 IMPLANT
TUBING ARTHROSCOPY IRRIG 16FT (MISCELLANEOUS) ×2 IMPLANT
WAND ABLATOR APOLLO I90 (BUR) IMPLANT
WRAP KNEE MAXI GEL POST OP (GAUZE/BANDAGES/DRESSINGS) ×2 IMPLANT

## 2024-01-10 NOTE — Discharge Instructions (Addendum)
 Adine Mon, MD [facility] [address] [place], [place] [address] [phone]   POST-OPERATIVE INSTRUCTIONS - Knee Arthroscopy  WOUND CARE - You may remove the Operative Dressing on Post-Op Day #3 (72hrs after surgery).   -  Alternatively if you would like you can leave dressing on until follow-up if within 7-8 days but keep it dry. - Leave steri-strips in place until they fall off on their own, usually 2 weeks postop. - An ACE wrap may be used to control swelling, do not wrap this too tight.  If the initial ACE wrap feels too tight you may loosen it. - There may be a small amount of fluid/bleeding leaking at the surgical site.  - This is normal; the knee is filled with fluid during the procedure and can leak for 24-48hrs after surgery. You may change/reinforce the bandage as needed.  - Use the Cryocuff or Ice as often as possible for the first 7 days, then as needed for pain relief. Always keep a towel, ACE wrap or other barrier between the cooling unit and your skin.  - You may shower on Post-Op Day #3. Gently pat the area dry.  - Do not soak the knee in water or submerge it.  - Do not go swimming in the pool or ocean until 4 weeks after surgery or when otherwise instructed.  Keep dry incisions as dry as possible.   BRACE/AMBULATION  -          You will not need a brace after this procedure.   - You may use crutches initially to help you weight bear, but this is not required - You can put full weight on your operative leg as you feel comfortable  PHYSICAL THERAPY - You will begin physical therapy soon after surgery (unless otherwise specified) - Please call to set up an appointment, if you do not already have one  - Let our office if there are any issues with scheduling your therapy   REGIONAL ANESTHESIA (NERVE BLOCKS) The anesthesia team may have performed a nerve block for you this is a great tool used to minimize pain.   The block may start  wearing off overnight (between 8-24 hours postop) When the block wears off, your pain may go from nearly zero to the pain you would have had postop without the block. This is an abrupt transition but nothing dangerous is happening.   This can be a challenging period but utilize your as needed pain medications to try and manage this period. We suggest you use the pain medication the first night prior to going to bed, to ease this transition.  You may take an extra dose of narcotic when this happens if needed   POST-OP MEDICATIONS- Multimodal approach to pain control In general your pain will be controlled with a combination of substances.  Prescriptions unless otherwise discussed are electronically sent to your pharmacy.  This is a carefully made plan we use to minimize narcotic use.     Ibuprofen  or Naproxen  - Anti-inflammatory medication taken on a scheduled basis Acetaminophen  - Non-narcotic pain medicine taken on a scheduled basis  Tramadol  - This is a strong narcotic, to be used only on an "as needed" basis for SEVERE pain. Aspirin 81mg  - This medicine is used to minimize the risk of blood clots after surgery. Zofran  - take as needed for nausea   FOLLOW-UP   Please call the office to schedule a follow-up appointment for your incision check,  10-14 days post-operatively.   IF YOU HAVE ANY QUESTIONS, PLEASE FEEL FREE TO CALL OUR OFFICE.   HELPFUL INFORMATION   Keep your leg elevated to decrease swelling, which will then in turn decrease your pain. I would elevate the foot of your bed by putting a couple of couch pillows between your mattress and box spring. I would not keep pillow directly under your ankle.  - Do not sleep with a pillow behind your knee even if it is more comfortable as this may make it harder to get your knee fully straight long term.   There will be MORE swelling on days 1-3 than there is on the day of surgery.  This also is normal. The swelling will decrease  with the anti-inflammatory medication, ice and keeping it elevated. The swelling will make it more difficult to bend your knee. As the swelling goes down your motion will become easier   You may develop swelling and bruising that extends from your knee down to your calf and perhaps even to your foot over the next week. Do not be alarmed. This too is normal, and it is due to gravity   There may be some numbness adjacent to the incision site. This may last for 6-12 months or longer in some patients and is expected.   You may return to sedentary work/school in the next couple of days when you feel up to it. You will need to keep your leg elevated as much as possible    You should wean off your narcotic medicines as soon as you are able.  Most patients will be off narcotics before their first postop appointment.    We suggest you use the pain medication the first night prior to going to bed, in order to ease any pain when the anesthesia wears off. You should avoid taking pain medications on an empty stomach as it will make you nauseous.   Do not drink alcoholic beverages or take illicit drugs when taking pain medications.   It is against the law to drive while taking narcotics. You cannot drive if your Right leg is in brace locked in extension.   Pain medication may make you constipated.  Below are a few solutions to try in this order:  o Decrease the amount of pain medication if you aren't having pain.  o Drink lots of decaffeinated fluids.  o Drink prune juice and/or eat dried prunes   o If the first 3 don't work start with additional solutions  o Take Colace - an over-the-counter stool softener  o Take Senokot - an over-the-counter laxative  o Take Miralax  - a stronger over-the-counter laxative   Take Tylenol  when you get home. Take ibuprofen  or Aleve  with dinner. May take tramadol  after 9:00pm.  Post Anesthesia Home Care Instructions  Activity: Get plenty of rest for the  remainder of the day. A responsible individual must stay with you for 24 hours following the procedure.  For the next 24 hours, DO NOT: -Drive a car -Advertising copywriter -Drink alcoholic beverages -Take any medication unless instructed by your physician -Make any legal decisions or sign important papers.  Meals: Start with liquid foods such as gelatin or soup. Progress to regular foods as tolerated. Avoid greasy, spicy, heavy foods. If nausea and/or vomiting occur, drink only clear liquids until the nausea and/or vomiting subsides. Call your physician if vomiting continues.  Special Instructions/Symptoms: Your throat may feel dry or sore from the anesthesia or the breathing tube placed  in your throat during surgery. If this causes discomfort, gargle with warm salt water. The discomfort should disappear within 24 hours.  If you had a scopolamine patch placed behind your ear for the management of post- operative nausea and/or vomiting:  1. The medication in the patch is effective for 72 hours, after which it should be removed.  Wrap patch in a tissue and discard in the trash. Wash hands thoroughly with soap and water. 2. You may remove the patch earlier than 72 hours if you experience unpleasant side effects which may include dry mouth, dizziness or visual disturbances. 3. Avoid touching the patch. Wash your hands with soap and water after contact with the patch.

## 2024-01-10 NOTE — H&P (Signed)
 Andre Reynolds is an 43 y.o. male.   Chief Complaint: Left knee pain HPI: Andre Reynolds is a 43 year old male with progressive left knee pain for over one year.  He denies any distinct injury or fall but has had worsening knee pain that is affecting his quality of life and ability to work.  He has undergone conservative treatment with anti-inflammatory medications, physical therapy and a corticosteroid injection without significant improvement.  MRI demonstrated a lateral meniscus tear and chondral thinning of his lateral tibial plateau.  He is indicated for a left knee arthroscopy with partial lateral meniscectomy and chondroplasty.  Past Medical History:  Diagnosis Date   Asthma    Depression    Diabetes mellitus (HCC)    DIVERTICULITIS, HX OF 08/03/2009   Qualifier: Diagnosis of  By: Kittie MD, Belvie     Hypertension    Pneumonia    Sleep apnea     Past Surgical History:  Procedure Laterality Date   WISDOM TOOTH EXTRACTION     WRIST SURGERY      Family History  Problem Relation Age of Onset   Diabetes Mother    Alcohol abuse Mother    Asthma Mother    Hypertension Mother    Breast cancer Mother    Diabetes Father    Drug abuse Sister    Diabetes Maternal Grandmother    Diabetes Maternal Aunt    Diabetes Cousin        maternal cousin   Social History:  reports that he has been smoking cigars. He has never used smokeless tobacco. He reports current alcohol use. He reports current drug use. Drug: Marijuana.  Allergies: No Known Allergies  Medications Prior to Admission  Medication Sig Dispense Refill   atorvastatin  (LIPITOR) 40 MG tablet Take 1 tablet (40 mg total) by mouth daily. 90 tablet 3   meloxicam (MOBIC) 7.5 MG tablet Take 7.5 mg by mouth 2 (two) times daily.     metFORMIN  (GLUCOPHAGE -XR) 500 MG 24 hr tablet Take 2 tablets (1,000 mg total) by mouth 2 (two) times daily. 200 tablet 4   polyethylene glycol powder (GLYCOLAX /MIRALAX ) 17 GM/SCOOP powder Take 17 g by mouth 2  (two) times daily as needed. 3350 g 1   traMADol  (ULTRAM ) 50 MG tablet Take 1 tablet (50 mg total) by mouth 2 (two) times daily as needed. 20 tablet 0   Accu-Chek Softclix Lancets lancets Use to check blood sugar 3 times per day. 100 each 12   amLODipine  (NORVASC ) 10 MG tablet Take 1 tablet (10 mg total) by mouth at bedtime. NEEDS APPT FOR FUTURE REFILLS. 90 tablet 3   blood glucose meter kit and supplies KIT Dispense based on patient and insurance preference. Use up to four times daily as directed. 1 each 0   Blood Glucose Monitoring Suppl (ACCU-CHEK COMPACT CARE KIT) KIT Use to check sugar three times a day 1 each 0   Blood Glucose Monitoring Suppl (ACCU-CHEK GUIDE ME) w/Device KIT USE AS DIRECTED 1 kit 0   diclofenac  Sodium (VOLTAREN ) 1 % GEL Apply 2 g topically 4 (four) times daily. 100 g 0   glucose blood (ACCU-CHEK AVIVA PLUS) test strip Please use to check blood glucose up to three times per day. E11.9 100 each 12   Glucose Blood (BLOOD GLUCOSE TEST STRIPS) STRP 1 each by In Vitro route in the morning, at noon, and at bedtime. May substitute to any manufacturer covered by patient's insurance. 100 strip 10   Lancet Devices (LANCING DEVICE)  MISC Use to check sugar three times a day 1 each 0   lidocaine  (LIDODERM ) 5 % Place 1 patch onto the skin daily. Remove & Discard patch within 12 hours or as directed by MD 30 patch 0   ondansetron  (ZOFRAN ) 4 MG tablet Take 1 tablet (4 mg total) by mouth every 6 (six) hours. 12 tablet 0    Results for orders placed or performed during the hospital encounter of 01/10/24 (from the past 48 hours)  Basic metabolic panel per protocol     Status: Abnormal   Collection Time: 01/08/24  1:19 PM  Result Value Ref Range   Sodium 136 135 - 145 mmol/L   Potassium 4.2 3.5 - 5.1 mmol/L   Chloride 105 98 - 111 mmol/L   CO2 24 22 - 32 mmol/L   Glucose, Bld 235 (H) 70 - 99 mg/dL    Comment: Glucose reference range applies only to samples taken after fasting for at  least 8 hours.   BUN 7 6 - 20 mg/dL   Creatinine, Ser 9.15 0.61 - 1.24 mg/dL   Calcium  8.8 (L) 8.9 - 10.3 mg/dL   GFR, Estimated >39 >39 mL/min    Comment: (NOTE) Calculated using the CKD-EPI Creatinine Equation (2021)    Anion gap 7 5 - 15    Comment: Performed at [facility], [address], [place], [place] [address]  Glucose, capillary     Status: Abnormal   Collection Time: 01/10/24 11:12 AM  Result Value Ref Range   Glucose-Capillary 207 (H) 70 - 99 mg/dL    Comment: Glucose reference range applies only to samples taken after fasting for at least 8 hours.   No results found.  Review of Systems  Constitutional: Negative.   HENT: Negative.    Respiratory: Negative.    Cardiovascular: Negative.   Genitourinary: Negative.   Musculoskeletal:  Positive for arthralgias and gait problem.  Neurological:  Negative for weakness and numbness.  Psychiatric/Behavioral: Negative.      Blood pressure (!) 148/95, pulse 75, temperature (!) 97.3 F (36.3 C), temperature source Temporal, resp. rate 15, height 5' 8 (1.727 m), weight 133.3 kg, SpO2 99%. Physical Exam Constitutional:      Appearance: Normal appearance.  HENT:     Head: Normocephalic and atraumatic.     Mouth/Throat:     Mouth: Mucous membranes are moist.  Eyes:     Extraocular Movements: Extraocular movements intact.     Pupils: Pupils are equal, round, and reactive to light.  Cardiovascular:     Rate and Rhythm: Normal rate and regular rhythm.     Pulses: Normal pulses.     Heart sounds: Normal heart sounds.  Pulmonary:     Effort: Pulmonary effort is normal.     Breath sounds: Normal breath sounds.  Abdominal:     General: Abdomen is flat.     Palpations: Abdomen is soft.  Musculoskeletal:     Comments: Left knee Mild effusion Tender to palpation on the lateral joint line Positive McMurray's test Range of motion 0-130 Neurovascular intact  Skin:    Capillary Refill: Capillary refill takes less than  2 seconds.  Neurological:     General: No focal deficit present.     Mental Status: He is alert and oriented to person, place, and time.  Psychiatric:        Mood and Affect: Mood normal.        Behavior: Behavior normal.      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  The risks and benefits of surgery were discussed with patient and all questions answered.  These risks include but are not limited to infection, bleeding, damage to surrounding structures, stiffness, DVT, prolonged rehabilitation, need for additional surgeries or treatments and cardiopulmonary complications from anesthesia including stroke, myocardial infarction or even death.  We also discussed his prognosis and improvement is dependent on the amount of degenerative changes seen at the time of surgery and if he has significant degenerative changes that may progress and require further treatments down the road.  He understands this and wished to proceed with surgery.  Adine JAYSON Mon, MD 01/10/2024, 11:24 AM

## 2024-01-10 NOTE — Anesthesia Procedure Notes (Signed)
 Procedure Name: LMA Insertion Date/Time: 01/10/2024 12:46 PM  Performed by: Delayne Olam BIRCH, CRNAPre-anesthesia Checklist: Patient identified, Emergency Drugs available, Suction available and Patient being monitored Patient Re-evaluated:Patient Re-evaluated prior to induction Oxygen Delivery Method: Circle system utilized Preoxygenation: Pre-oxygenation with 100% oxygen Induction Type: IV induction Ventilation: Mask ventilation without difficulty LMA: LMA inserted LMA Size: 5.0 Number of attempts: 1 Airway Equipment and Method: Bite block Placement Confirmation: positive ETCO2 Tube secured with: Tape Dental Injury: Teeth and Oropharynx as per pre-operative assessment

## 2024-01-10 NOTE — Anesthesia Preprocedure Evaluation (Addendum)
 Anesthesia Evaluation  Patient identified by MRN, date of birth, ID band Patient awake    Reviewed: Allergy & Precautions, NPO status , Patient's Chart, lab work & pertinent test results  Airway Mallampati: I  TM Distance: >3 FB Neck ROM: Full    Dental  (+) Teeth Intact, Dental Advisory Given   Pulmonary asthma , sleep apnea , pneumonia, Current Smoker   breath sounds clear to auscultation       Cardiovascular hypertension, Pt. on medications  Rhythm:Regular Rate:Normal     Neuro/Psych  PSYCHIATRIC DISORDERS  Depression     Neuromuscular disease    GI/Hepatic negative GI ROS, Neg liver ROS,,,  Endo/Other  diabetes, Type 2, Oral Hypoglycemic Agents    Renal/GU negative Renal ROS     Musculoskeletal negative musculoskeletal ROS (+)    Abdominal   Peds  Hematology negative hematology ROS (+)   Anesthesia Other Findings   Reproductive/Obstetrics                              Anesthesia Physical Anesthesia Plan  ASA: 3  Anesthesia Plan: General   Post-op Pain Management:    Induction: Intravenous  PONV Risk Score and Plan: 2 and Ondansetron , Dexamethasone  and Midazolam   Airway Management Planned: LMA  Additional Equipment: None  Intra-op Plan:   Post-operative Plan: Extubation in OR  Informed Consent: I have reviewed the patients History and Physical, chart, labs and discussed the procedure including the risks, benefits and alternatives for the proposed anesthesia with the patient or authorized representative who has indicated his/her understanding and acceptance.     Dental advisory given  Plan Discussed with: CRNA  Anesthesia Plan Comments:          Anesthesia Quick Evaluation

## 2024-01-10 NOTE — Op Note (Signed)
 .Orthopaedic Surgery Operative Note (CSN: [id number])  Andre Reynolds  07/31/1980 Date of Surgery: 01/10/2024   Diagnoses:  LEFT KNEE MEDIAL MENISCUS TEAR, ARTHRITIS  Procedure: LEFT KNEE ARTHROSCOPY WITH PARTIAL MEDIAL MENISCECTOMY AND CHONDROPLASTY  Post-operative plan: The patient will be weight bearing as tolerated left leg.  DVT prophylaxis Aspirin 81 mg twice daily for 2 weeks.   Pain control with PRN pain medication preferring oral medicines.  Follow up plan will be scheduled in approximately 10-14 days for incision check.  Post-Op Diagnosis: Same Surgeons:Primary: Andre Adine BROCKS, MD Assistants:None Location: [facility] OR ROOM 1 Anesthesia: General Antibiotics: Ancef  2 g Tourniquet time:  Total Tourniquet Time Documented: Thigh (Left) - 30 minutes Total: Thigh (Left) - 30 minutes  Estimated Blood Loss: Minimal Complications: None Specimens: None Implants: * No implants in log *  Indications for Surgery:   [name] is a 43 year old male with progressive left knee pain for over one year. He denies any distinct injury or fall but has had worsening knee pain that is affecting his quality of life and ability to work. He has undergone conservative treatment with anti-inflammatory medications, physical therapy and a corticosteroid injection without significant improvement. MRI demonstrated a lateral meniscus tear and chondral thinning of his lateral tibial plateau. He is indicated for a left knee arthroscopy with partial lateral meniscectomy and chondroplasty.   The risks and benefits of surgery were discussed with patient and all questions answered. These risks include but are not limited to infection, bleeding, damage to surrounding structures, stiffness, DVT, prolonged rehabilitation, need for additional surgeries or treatments and cardiopulmonary complications from anesthesia including stroke, myocardial infarction or even death. We also discussed his prognosis and improvement is  dependent on the amount of degenerative changes seen at the time of surgery and if he has significant degenerative changes that may progress and require further treatments down the road. He understands this and wished to proceed with surgery.   Examination under anesthesia: The patient's left knee had full range of motion equal to his contralateral side.  He had a stable ligamentous exam  Arthroscopic findings Suprapatellar pouch:There were no loose bodies.  The chondral surfaces of the patella and trochlea were intact.  Medial compartment:There is a posterior horn medial meniscus tear  just medial to the posterior root attachment.  There is an area approximately 15 x 15 mm of grade 2 chondral changes on the medial femoral condyle.  Lateral Compartment: There is mild free edge fraying of the lateral meniscus.  There is an area approximately 10 mm x 15 mm of grade 3 chondral change with loose flaps of the lateral tibial plateau.  Lateral femoral condyle chondral surface was intact  Intercondylar Notch: ACL and PCL were intact   Procedure:   The patient was identified properly. Informed consent was obtained and the surgical site was marked. The patient was taken up to suite where general anesthesia was induced. The patient was placed in the supine position with a post against the surgical leg and a nonsterile tourniquet applied. The surgical leg was then prepped and draped usual sterile fashion.  A standard surgical timeout was performed.  2 standard anterior portals were made and diagnostic arthroscopy performed. Please note the findings as noted above.  The instruments were placed in the medial compartment.  Using a combination of meniscal biters and shaver a partial medial meniscectomy was performed back to a stable rim.  A gentle chondroplasty of the medial femoral condyle was performed using a combination of shaver  and RF ablator.  We then turned our attention to the lateral compartment. The  free edge fraying of the lateral meniscus was trimmed with a shaver. A gentle chondroplasty of the lateral tibial plateau was performed with a combination of shaver and RF ablator back to a stable base.  Incisions closed with 3-0 nylon sutures. The knee was injected with 20 cc of 0.25% Marcaine . The patient was awoken from general anesthesia and taken to the PACU in stable condition without complication.

## 2024-01-10 NOTE — Anesthesia Postprocedure Evaluation (Signed)
 Anesthesia Post Note  Patient: Andre Reynolds  Procedure(s) Performed: ARTHROSCOPY, KNEE, WITH LATERAL MENISCECTOMY (Left: Knee)     Patient location during evaluation: PACU Anesthesia Type: General Level of consciousness: awake and alert Pain management: pain level controlled Vital Signs Assessment: post-procedure vital signs reviewed and stable Respiratory status: spontaneous breathing, nonlabored ventilation, respiratory function stable and patient connected to nasal cannula oxygen Cardiovascular status: blood pressure returned to baseline and stable Postop Assessment: no apparent nausea or vomiting Anesthetic complications: no   No notable events documented.  Last Vitals:  Vitals:   01/10/24 1430 01/10/24 1433  BP: (!) 150/100   Pulse: 77 74  Resp: 15 14  Temp:    SpO2: 94% 95%    Last Pain:  Vitals:   01/10/24 1430  TempSrc:   PainSc: 4     LLE Motor Response: Purposeful movement (01/10/24 1430) LLE Sensation: Full sensation (01/10/24 1430)          [name]

## 2024-01-10 NOTE — Transfer of Care (Signed)
 Immediate Anesthesia Transfer of Care Note  Patient: Andre Reynolds Immediate Anesthesia Transfer of Care Note  Patient: Andre Reynolds  Procedure(s) Performed: Procedure(s) (LRB): ARTHROSCOPY, KNEE, WITH LATERAL MENISCECTOMY (Left)  Patient Location: PACU  Anesthesia Type: MAC  Level of Consciousness: awake, alert , oriented and patient cooperative  Airway & Oxygen Therapy: Patient Spontanous Breathing and Patient connected to face mask oxygen  Post-op Assessment: Report given to PACU RN and Post -op Vital signs reviewed and stable  Post vital signs: Reviewed and stable  Complications: No apparent anesthesia complications  Last Vitals:  Vitals Value Taken Time  BP 148/92 01/10/24 13:37  Temp    Pulse 82 01/10/24 13:40  Resp 16 01/10/24 13:40  SpO2 96 % 01/10/24 13:40  Vitals shown include unfiled device data.  Last Pain:  Vitals:   01/10/24 1102  TempSrc: Temporal  PainSc: 0-No pain         Complications: No notable events documented.

## 2024-01-11 ENCOUNTER — Encounter (HOSPITAL_BASED_OUTPATIENT_CLINIC_OR_DEPARTMENT_OTHER): Payer: Self-pay | Admitting: Orthopedic Surgery

## 2024-01-17 DIAGNOSIS — M65872 Other synovitis and tenosynovitis, left ankle and foot: Secondary | ICD-10-CM | POA: Diagnosis not present

## 2024-01-17 DIAGNOSIS — G8918 Other acute postprocedural pain: Secondary | ICD-10-CM | POA: Diagnosis not present

## 2024-01-17 DIAGNOSIS — M89372 Hypertrophy of bone, left ankle and foot: Secondary | ICD-10-CM | POA: Diagnosis not present

## 2024-01-17 DIAGNOSIS — M898X7 Other specified disorders of bone, ankle and foot: Secondary | ICD-10-CM | POA: Diagnosis not present

## 2024-01-17 DIAGNOSIS — M7672 Peroneal tendinitis, left leg: Secondary | ICD-10-CM | POA: Diagnosis not present

## 2024-01-17 DIAGNOSIS — M65972 Unspecified synovitis and tenosynovitis, left ankle and foot: Secondary | ICD-10-CM | POA: Diagnosis not present

## 2024-01-18 DIAGNOSIS — M79672 Pain in left foot: Secondary | ICD-10-CM | POA: Diagnosis not present

## 2024-02-12 ENCOUNTER — Telehealth: Payer: Self-pay

## 2024-02-12 NOTE — Telephone Encounter (Signed)
 Patients wife calls nurse line in regards to Cpap Supplies.   Cpap was ordered back in July, however I do not see where it was sent to [employer].   Will send message to [employer] for processing.   Advised wife to check back in a few days. She reports she receives her Cpap Supplies from them as well.   Community message sent.

## 2024-02-14 NOTE — Telephone Encounter (Signed)
Confirmation received from [employer].  

## 2024-02-19 ENCOUNTER — Encounter: Payer: Self-pay | Admitting: Family Medicine

## 2024-02-19 DIAGNOSIS — G4733 Obstructive sleep apnea (adult) (pediatric): Secondary | ICD-10-CM

## 2024-02-19 DIAGNOSIS — R0683 Snoring: Secondary | ICD-10-CM

## 2024-02-20 LAB — HM DIABETES EYE EXAM

## 2024-02-20 NOTE — Telephone Encounter (Signed)
Community message sent to [employer]. 

## 2024-02-27 DIAGNOSIS — M67472 Ganglion, left ankle and foot: Secondary | ICD-10-CM | POA: Diagnosis not present

## 2024-02-27 DIAGNOSIS — M79672 Pain in left foot: Secondary | ICD-10-CM | POA: Diagnosis not present

## 2024-03-18 NOTE — Telephone Encounter (Signed)
 Patient's wife returns call to nurse line regarding issues with getting CPAP.   She reports that she called [facility] and was told that they did not have order for machine.   I informed patient's wife that we sent order and that I would follow up with [facility] regarding issue.   Called [employer]. Representative advised that order needs to include numerical setting (minimum and maximum) in order for order to be processed.   Will forward to Dr. Cleotilde to update order.   Chiquita JAYSON English, RN

## 2024-03-26 NOTE — Addendum Note (Signed)
 Addended by: Trevonte Ashkar on: 03/26/2024 03:27 PM   Modules accepted: Orders

## 2024-03-27 NOTE — Telephone Encounter (Signed)
 After speaking with a CPAP technician at [facility], we were able to determine that order can be placed for Autopap, however, they recommend placing order with setting of 4-20. The machine would then be able to automatically adjust as needed, depending on the patient.   I have updated order below.   Please sign off and send back to me. I can then send to [employer].   Thanks!!

## 2024-03-27 NOTE — Addendum Note (Signed)
 Addended by: Christan Defranco C on: 03/27/2024 04:46 PM   Modules accepted: Orders

## 2024-03-28 NOTE — Addendum Note (Signed)
 Addended by: Madaleine Simmon C on: 03/28/2024 08:24 AM   Modules accepted: Orders

## 2024-03-28 NOTE — Telephone Encounter (Signed)
 Order resent to [facility]. Will await response.   Chiquita JAYSON English, RN

## 2024-03-28 NOTE — Addendum Note (Signed)
 Addended by: Karianna Gusman on: 03/28/2024 07:10 AM   Modules accepted: Orders

## 2024-03-29 ENCOUNTER — Telehealth: Admitting: Physician Assistant

## 2024-03-29 DIAGNOSIS — R0602 Shortness of breath: Secondary | ICD-10-CM

## 2024-03-29 DIAGNOSIS — R062 Wheezing: Secondary | ICD-10-CM

## 2024-03-29 NOTE — Progress Notes (Signed)
   Thank you for the details you included in the comment boxes. Those details are very helpful in determining the best course of treatment for you and help us  to provide the best care.Because of wheezing and shortness of breath and need, we recommend that you schedule a Virtual Urgent Care video visit in order for the provider to better assess what is going on.  The provider will be able to give you a more accurate diagnosis and treatment plan if we can more freely discuss your symptoms and with the addition of a virtual examination.   If you change your visit to a video visit, we will bill your insurance (similar to an office visit) and you will not be charged for this e-Visit. You will be able to stay at home and speak with the first available [facility] advanced practice provider. The link to do a video visit is in the drop down Menu tab of your Welcome screen in MyChart.

## 2024-04-01 NOTE — Telephone Encounter (Signed)
 Receipt of new order confirmed by [employer].   Chiquita JAYSON English, RN

## 2024-04-14 ENCOUNTER — Encounter: Payer: Self-pay | Admitting: Radiology

## 2024-07-01 ENCOUNTER — Emergency Department (HOSPITAL_COMMUNITY)
Admission: EM | Admit: 2024-07-01 | Discharge: 2024-07-01 | Disposition: A | Discharge status: Home / Self Care | Attending: Emergency Medicine | Admitting: Emergency Medicine

## 2024-07-01 ENCOUNTER — Other Ambulatory Visit: Payer: Self-pay

## 2024-07-01 ENCOUNTER — Emergency Department (HOSPITAL_COMMUNITY)

## 2024-07-01 DIAGNOSIS — S92414A Nondisplaced fracture of proximal phalanx of right great toe, initial encounter for closed fracture: Secondary | ICD-10-CM | POA: Diagnosis not present

## 2024-07-01 DIAGNOSIS — W3400XA Accidental discharge from unspecified firearms or gun, initial encounter: Secondary | ICD-10-CM | POA: Insufficient documentation

## 2024-07-01 DIAGNOSIS — Y249XXA Unspecified firearm discharge, undetermined intent, initial encounter: Secondary | ICD-10-CM

## 2024-07-01 DIAGNOSIS — S92414B Nondisplaced fracture of proximal phalanx of right great toe, initial encounter for open fracture: Secondary | ICD-10-CM

## 2024-07-01 DIAGNOSIS — S91131A Puncture wound without foreign body of right great toe without damage to nail, initial encounter: Secondary | ICD-10-CM | POA: Diagnosis present

## 2024-07-01 LAB — CBC WITH DIFFERENTIAL/PLATELET
Abs Immature Granulocytes: 0.03 K/uL (ref 0.00–0.07)
Basophils Absolute: 0 K/uL (ref 0.0–0.1)
Basophils Relative: 0 %
Eosinophils Absolute: 0 K/uL (ref 0.0–0.5)
Eosinophils Relative: 0 %
HCT: 44.9 % (ref 39.0–52.0)
Hemoglobin: 15.1 g/dL (ref 13.0–17.0)
Immature Granulocytes: 0 %
Lymphocytes Relative: 39 %
Lymphs Abs: 3.9 K/uL (ref 0.7–4.0)
MCH: 28.7 pg (ref 26.0–34.0)
MCHC: 33.6 g/dL (ref 30.0–36.0)
MCV: 85.2 fL (ref 80.0–100.0)
Monocytes Absolute: 1.1 K/uL — ABNORMAL HIGH (ref 0.1–1.0)
Monocytes Relative: 11 %
Neutro Abs: 5 K/uL (ref 1.7–7.7)
Neutrophils Relative %: 50 %
Platelets: 246 K/uL (ref 150–400)
RBC: 5.27 MIL/uL (ref 4.22–5.81)
RDW: 14.5 % (ref 11.5–15.5)
WBC: 10.1 K/uL (ref 4.0–10.5)
nRBC: 0 % (ref 0.0–0.2)

## 2024-07-01 LAB — BASIC METABOLIC PANEL WITH GFR
Anion gap: 12 (ref 5–15)
BUN: 6 mg/dL (ref 6–20)
CO2: 23 mmol/L (ref 22–32)
Calcium: 8.7 mg/dL — ABNORMAL LOW (ref 8.9–10.3)
Chloride: 100 mmol/L (ref 98–111)
Creatinine, Ser: 0.83 mg/dL (ref 0.61–1.24)
GFR, Estimated: >60 mL/min
Glucose, Bld: 188 mg/dL — ABNORMAL HIGH (ref 70–99)
Potassium: 3.4 mmol/L — ABNORMAL LOW (ref 3.5–5.1)
Sodium: 136 mmol/L (ref 135–145)

## 2024-07-01 MED ORDER — HYDROCODONE-ACETAMINOPHEN 5-325 MG PO TABS: 1.0000 | ORAL_TABLET | ORAL | 0 refills | Status: AC | PRN

## 2024-07-01 MED ORDER — CEPHALEXIN 500 MG PO CAPS: 500.0000 mg | ORAL_CAPSULE | Freq: Four times a day (QID) | ORAL | 0 refills | Status: AC

## 2024-07-01 MED ORDER — CEFAZOLIN SODIUM-DEXTROSE 1-4 GM/50ML-% IV SOLN
1.0000 g | Freq: Once | INTRAVENOUS | Status: AC
  Administered 2024-07-01: 1 g via INTRAVENOUS
  Filled 2024-07-01: qty 50

## 2024-07-01 MED ORDER — TETANUS-DIPHTH-ACELL PERTUSSIS 5-2-15.5 LF-MCG/0.5 IM SUSP
0.5000 mL | Freq: Once | INTRAMUSCULAR | Status: AC
  Administered 2024-07-01: 0.5 mL via INTRAMUSCULAR
  Filled 2024-07-01: qty 0.5

## 2024-07-01 MED ORDER — DOXYCYCLINE HYCLATE 100 MG PO CAPS: 100.0000 mg | ORAL_CAPSULE | Freq: Two times a day (BID) | ORAL | 0 refills | Status: AC
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
